# Patient Record
Sex: Female | Born: 1975 | Race: White | Hispanic: No | Marital: Married | Smoking: Never smoker
Health system: Southern US, Community
[De-identification: ages and names within clinical notes are randomized; demographics above are authoritative.]

## PROBLEM LIST (undated history)

## (undated) DIAGNOSIS — R519 Headache, unspecified: Secondary | ICD-10-CM

## (undated) HISTORY — PX: DENTAL SURGERY: SHX609

---

## 2020-03-04 ENCOUNTER — Telehealth: Payer: Self-pay | Admitting: Internal Medicine

## 2020-03-04 NOTE — Telephone Encounter (Signed)
Returned call to [employer], Debbie. States she spoke with someone at [facility] and they are requesting [facility] contact them for patient's records. States they won't need a ROI as it is Peer to Peer. Call placed to [facility] at [facility]. She is requesting an email sent to her at [email or web address] requesting records. Per [facility] policy, patient will need to fill out ROI that can then be faxed to [facility]. Debbie made aware. She will have patient sign ROI at upcoming visit and will do the same for the other 4 members of patient's family. Kinnie Feil, BSN, RN-BC

## 2020-03-04 NOTE — Telephone Encounter (Signed)
[place] Sponsor [employer] requesting a call back about Requesting Lab work from [employer] and other paper work for the patient's visit on Monday 03/08/2020.  She has other concerns and questions about this patient.

## 2020-03-08 ENCOUNTER — Ambulatory Visit (HOSPITAL_COMMUNITY)
Admission: RE | Admit: 2020-03-08 | Discharge: 2020-03-08 | Disposition: A | Discharge status: Home / Self Care | Payer: Medicaid Other | Source: Ambulatory Visit | Attending: Internal Medicine | Admitting: Internal Medicine

## 2020-03-08 ENCOUNTER — Other Ambulatory Visit: Payer: Self-pay

## 2020-03-08 ENCOUNTER — Ambulatory Visit (INDEPENDENT_AMBULATORY_CARE_PROVIDER_SITE_OTHER): Payer: Medicaid Other | Admitting: Internal Medicine

## 2020-03-08 VITALS — BP 97/62 | HR 82 | Temp 98.2°F | Ht 62.0 in | Wt 151.2 lb

## 2020-03-08 DIAGNOSIS — G8929 Other chronic pain: Secondary | ICD-10-CM | POA: Diagnosis not present

## 2020-03-08 DIAGNOSIS — R5383 Other fatigue: Secondary | ICD-10-CM | POA: Diagnosis not present

## 2020-03-08 DIAGNOSIS — M5440 Lumbago with sciatica, unspecified side: Secondary | ICD-10-CM

## 2020-03-08 DIAGNOSIS — M5442 Lumbago with sciatica, left side: Secondary | ICD-10-CM

## 2020-03-08 DIAGNOSIS — M25511 Pain in right shoulder: Secondary | ICD-10-CM | POA: Diagnosis not present

## 2020-03-08 DIAGNOSIS — E559 Vitamin D deficiency, unspecified: Secondary | ICD-10-CM | POA: Diagnosis not present

## 2020-03-08 DIAGNOSIS — Z0000 Encounter for general adult medical examination without abnormal findings: Secondary | ICD-10-CM | POA: Insufficient documentation

## 2020-03-08 LAB — GLUCOSE, CAPILLARY: Glucose-Capillary: 117 mg/dL — ABNORMAL HIGH (ref 70–99)

## 2020-03-08 LAB — POCT GLYCOSYLATED HEMOGLOBIN (HGB A1C): Hemoglobin A1C: 6 % — AB (ref 4.0–5.6)

## 2020-03-08 MED ORDER — DICLOFENAC SODIUM 1 % EX GEL: 2.0000 g | Freq: Four times a day (QID) | CUTANEOUS | 1 refills | Status: DC
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
## 2020-03-08 NOTE — Patient Instructions (Signed)
Thank you for allowing [place] to provide your care today. Today we discussed your back and shoulder pain and preventive care   I have ordered several labs for you. I will call if any are abnormal.    Please use Voltaren gel for shoulder and back pain as needed. You can also take over the counter Tylenol and take 1 tablet up to 3 times a day only as needed for pain.   Please come back to clinic in 1 month fr PCP visit and follow up. As always, if having severe symptoms, please seek medical attention at emergency room. Should you have any questions or concerns please call the internal medicine clinic at [phone].    Thank you!

## 2020-03-09 LAB — CBC WITH DIFFERENTIAL/PLATELET
Basophils Absolute: 0 10*3/uL (ref 0.0–0.2)
Basos: 0 %
EOS (ABSOLUTE): 0.2 10*3/uL (ref 0.0–0.4)
Eos: 3 %
Hematocrit: 39.9 % (ref 34.0–46.6)
Hemoglobin: 13.6 g/dL (ref 11.1–15.9)
Immature Grans (Abs): 0 10*3/uL (ref 0.0–0.1)
Immature Granulocytes: 0 %
Lymphocytes Absolute: 2.6 10*3/uL (ref 0.7–3.1)
Lymphs: 44 %
MCH: 28.5 pg (ref 26.6–33.0)
MCHC: 34.1 g/dL (ref 31.5–35.7)
MCV: 84 fL (ref 79–97)
Monocytes Absolute: 0.4 10*3/uL (ref 0.1–0.9)
Monocytes: 7 %
Neutrophils Absolute: 2.7 10*3/uL (ref 1.4–7.0)
Neutrophils: 46 %
Platelets: 194 10*3/uL (ref 150–450)
RBC: 4.78 x10E6/uL (ref 3.77–5.28)
RDW: 13.1 % (ref 11.7–15.4)
WBC: 6 10*3/uL (ref 3.4–10.8)

## 2020-03-09 LAB — LIPID PANEL
Chol/HDL Ratio: 3.9 ratio (ref 0.0–4.4)
Cholesterol, Total: 148 mg/dL (ref 100–199)
HDL: 38 mg/dL — ABNORMAL LOW (ref >39)
LDL Chol Calc (NIH): 89 mg/dL (ref 0–99)
Triglycerides: 115 mg/dL (ref 0–149)
VLDL Cholesterol Cal: 21 mg/dL (ref 5–40)

## 2020-03-09 LAB — IRON AND TIBC
Iron Saturation: 11 % — ABNORMAL LOW (ref 15–55)
Iron: 33 ug/dL (ref 27–159)
Total Iron Binding Capacity: 297 ug/dL (ref 250–450)
UIBC: 264 ug/dL (ref 131–425)

## 2020-03-09 LAB — CMP14 + ANION GAP
ALT: 31 IU/L (ref 0–32)
AST: 36 IU/L (ref 0–40)
Albumin/Globulin Ratio: 1.2 (ref 1.2–2.2)
Albumin: 4.2 g/dL (ref 3.8–4.8)
Alkaline Phosphatase: 92 IU/L (ref 44–121)
Anion Gap: 13 mmol/L (ref 10.0–18.0)
BUN/Creatinine Ratio: 30 — ABNORMAL HIGH (ref 9–23)
BUN: 29 mg/dL — ABNORMAL HIGH (ref 6–24)
Bilirubin Total: 0.3 mg/dL (ref 0.0–1.2)
CO2: 18 mmol/L — ABNORMAL LOW (ref 20–29)
Calcium: 9.2 mg/dL (ref 8.7–10.2)
Chloride: 108 mmol/L — ABNORMAL HIGH (ref 96–106)
Creatinine, Ser: 0.98 mg/dL (ref 0.57–1.00)
GFR calc Af Amer: 82 mL/min/{1.73_m2} (ref >59)
GFR calc non Af Amer: 71 mL/min/{1.73_m2} (ref >59)
Globulin, Total: 3.4 g/dL (ref 1.5–4.5)
Glucose: 112 mg/dL — ABNORMAL HIGH (ref 65–99)
Potassium: 3.4 mmol/L — ABNORMAL LOW (ref 3.5–5.2)
Sodium: 139 mmol/L (ref 134–144)
Total Protein: 7.6 g/dL (ref 6.0–8.5)

## 2020-03-09 LAB — TSH: TSH: 2.54 u[IU]/mL (ref 0.450–4.500)

## 2020-03-09 LAB — VITAMIN D 25 HYDROXY (VIT D DEFICIENCY, FRACTURES): Vit D, 25-Hydroxy: 25.9 ng/mL — ABNORMAL LOW (ref 30.0–100.0)

## 2020-03-09 MED ORDER — FERROUS SULFATE 325 (65 FE) MG PO TBEC: 325.0000 mg | DELAYED_RELEASE_TABLET | Freq: Two times a day (BID) | ORAL | 3 refills | Status: AC

## 2020-03-09 MED ORDER — VITAMIN D 50 MCG (2000 UT) PO TABS: 2000.0000 [IU] | ORAL_TABLET | Freq: Every day | ORAL | 2 refills | Status: DC

## 2020-03-10 ENCOUNTER — Encounter: Payer: Self-pay | Admitting: Internal Medicine

## 2020-03-10 DIAGNOSIS — M5412 Radiculopathy, cervical region: Secondary | ICD-10-CM | POA: Insufficient documentation

## 2020-03-10 DIAGNOSIS — R5383 Other fatigue: Secondary | ICD-10-CM | POA: Insufficient documentation

## 2020-03-10 DIAGNOSIS — M25511 Pain in right shoulder: Secondary | ICD-10-CM | POA: Insufficient documentation

## 2020-03-10 DIAGNOSIS — M5440 Lumbago with sciatica, unspecified side: Secondary | ICD-10-CM | POA: Insufficient documentation

## 2020-03-10 DIAGNOSIS — G8929 Other chronic pain: Secondary | ICD-10-CM | POA: Insufficient documentation

## 2020-03-10 DIAGNOSIS — E559 Vitamin D deficiency, unspecified: Secondary | ICD-10-CM | POA: Insufficient documentation

## 2020-03-10 NOTE — Assessment & Plan Note (Signed)
No red flag on symptoms or physical exam.  Has some mild tenderness on spine. Will obtain lubmar Xray per patient 'request> Unremarkable except DDD.  -Voltaren gel PRN
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
## 2020-03-10 NOTE — Assessment & Plan Note (Addendum)
-  Checking Vitamin D>Vit D level is low. Sending script for Vit D 2000 U QD. Patient notified.

## 2020-03-10 NOTE — Assessment & Plan Note (Signed)
Checking CBC, Stool ova and parasite, H pylori stool Ag (also reports occasional pus in her stool), CBC, TSH, Iron TIBC, CMP.  Labs suggestive of IDA> Sending script for ferrous sulfate.

## 2020-03-10 NOTE — Assessment & Plan Note (Deleted)
-  Checking Vitamin D>Vit D level is low. Sending script for Vit D 2000 U QD.

## 2020-03-10 NOTE — Assessment & Plan Note (Signed)
Checking Lipid panel today. 

## 2020-03-12 ENCOUNTER — Other Ambulatory Visit: Payer: Medicaid Other

## 2020-03-12 ENCOUNTER — Other Ambulatory Visit: Payer: Self-pay

## 2020-03-12 NOTE — Progress Notes (Signed)
Internal Medicine Clinic Attending  Case discussed with Dr. Masoudi  At the time of the visit.  We reviewed the resident's history and exam and pertinent patient test results.  I agree with the assessment, diagnosis, and plan of care documented in the resident's note.  

## 2020-03-15 ENCOUNTER — Telehealth: Payer: Self-pay | Admitting: Internal Medicine

## 2020-03-15 NOTE — Telephone Encounter (Signed)
I called her and talked to her. Thanks

## 2020-03-15 NOTE — Telephone Encounter (Signed)
Pt's Sponsor Debbie requesting a phone call back in reference to her stool samples and did Dr. Maryla Morrow give a number of Carbs per day.     Please call back.

## 2020-03-16 LAB — OVA AND PARASITE EXAMINATION

## 2020-03-16 LAB — STOOL CULTURE: E coli, Shiga toxin Assay: NEGATIVE

## 2020-03-16 LAB — H. PYLORI ANTIGEN, STOOL: H pylori Ag, Stl: NEGATIVE

## 2020-03-29 ENCOUNTER — Other Ambulatory Visit: Payer: Self-pay | Admitting: Internal Medicine

## 2020-03-29 DIAGNOSIS — Z0000 Encounter for general adult medical examination without abnormal findings: Secondary | ICD-10-CM

## 2020-03-29 NOTE — Addendum Note (Signed)
Addended byChevis Pretty on: 03/29/2020 11:40 AM   Modules accepted: Orders

## 2020-03-29 NOTE — Progress Notes (Signed)
Repeating H Pylori to rule out false negative test.

## 2020-04-01 ENCOUNTER — Other Ambulatory Visit: Payer: Self-pay

## 2020-04-01 ENCOUNTER — Ambulatory Visit (INDEPENDENT_AMBULATORY_CARE_PROVIDER_SITE_OTHER): Payer: Medicaid Other | Admitting: Student

## 2020-04-01 ENCOUNTER — Encounter: Payer: Self-pay | Admitting: Student

## 2020-04-01 VITALS — BP 107/65 | HR 69 | Temp 98.4°F | Ht 62.0 in | Wt 149.2 lb

## 2020-04-01 DIAGNOSIS — R5383 Other fatigue: Secondary | ICD-10-CM | POA: Diagnosis not present

## 2020-04-01 DIAGNOSIS — Z0000 Encounter for general adult medical examination without abnormal findings: Secondary | ICD-10-CM | POA: Diagnosis not present

## 2020-04-01 DIAGNOSIS — Z124 Encounter for screening for malignant neoplasm of cervix: Secondary | ICD-10-CM | POA: Diagnosis not present

## 2020-04-01 DIAGNOSIS — G43009 Migraine without aura, not intractable, without status migrainosus: Secondary | ICD-10-CM | POA: Insufficient documentation

## 2020-04-01 DIAGNOSIS — R519 Headache, unspecified: Secondary | ICD-10-CM | POA: Diagnosis not present

## 2020-04-01 DIAGNOSIS — R7303 Prediabetes: Secondary | ICD-10-CM | POA: Diagnosis not present

## 2020-04-01 DIAGNOSIS — M5440 Lumbago with sciatica, unspecified side: Secondary | ICD-10-CM | POA: Diagnosis not present

## 2020-04-01 DIAGNOSIS — G8929 Other chronic pain: Secondary | ICD-10-CM

## 2020-04-01 DIAGNOSIS — E559 Vitamin D deficiency, unspecified: Secondary | ICD-10-CM | POA: Diagnosis not present

## 2020-04-01 DIAGNOSIS — N912 Amenorrhea, unspecified: Secondary | ICD-10-CM

## 2020-04-01 LAB — POCT URINE PREGNANCY: Preg Test, Ur: NEGATIVE

## 2020-04-01 NOTE — Assessment & Plan Note (Signed)
Patient reports adherence to and tolerance of ferrous sulfate 325 mg twice daily which was started at last visit for iron studies suggestive of IDA.  Plan: - continue ferrous sulfate 325 mg twice daily

## 2020-04-01 NOTE — Assessment & Plan Note (Signed)
Patient reports she has never had a pap smear. Counseled on the benefits of screening. Talked patient through the steps of a pap smear. She states she is fearful of the pain/discomfort and would like to think further about it.  Plan: - Instructed patient to call the clinic if she would like to schedule a pap smear - Otherwise, address at next visit

## 2020-04-01 NOTE — Assessment & Plan Note (Signed)
She reports she has not had a menstrual period in 6 months. States she is sexually active with her husband. Does not think she is pregnant. Pregnancy test performed in clinic today was NEGATIVE.  Assessment/Plan: Patient is relatively young to be perimenopausal. Does not seem to have any symptoms suggestive of perimenopause. - Readdress at next visit

## 2020-04-01 NOTE — Assessment & Plan Note (Signed)
Patient reports over the last 3 years she has had periodic headaches after being outside in the sun for long periods of time. However over the last month since cutting out carbs she has headaches in between meals. The headaches resolve with eating and hydration. She denies associated photophobia, nausea/vomiting, lacrimation, rhinorrhea. Has not tried ibuprofen as the headaches improve with eating.  A/P: Headaches could be related to hypoglycemia as the patient has made some significant changes to her diet since finding out she has pre-diabetes, and the headaches improve with eating. Does not sound like migraine or even tension headache. No red flag symptoms. - Recommended patient eat a high protein snack in between meals - Continue to monitor

## 2020-04-01 NOTE — Assessment & Plan Note (Signed)
Patient reports she is using the voltaren gel prescribed at last visit (03/10/20) nightly before bed, and her back pain is significantly improved. On exam today she has no lower back tenderness to palpation. She expresses concern regarding the findings of mild multilevel lumbar spine DDD on the lumbar xray performed after last visit. Discussed that these are age-related changes that we will continue to monitor based on her symptoms.  - continue Voltaren gel PRN

## 2020-04-01 NOTE — Assessment & Plan Note (Signed)
Patient reports she has been taking the Vit D 2000 U supplement daily and has noticed improvement in her energy level.  Plan: - continue Vit D 2000 U daily

## 2020-04-01 NOTE — Patient Instructions (Addendum)
Kristina Jimenez,   Thank you for your visit to the [facility] today. It was a pleasure meeting you. Today we discussed the following:  1) Pre-diabetes - Your A1c at your last visit was 6.0% which puts you in the category of PRE-diabetes - We will not re-check your A1c until at least 2 months from now - I have made a referral to our diabetes educator, Lupita Leash, so you can learn more about pre-diabetes and healthy diet and lifestyle  2) 6 months without menstrual period:  - Pregnancy test today, I will call you with the result  3) Headaches - I think your headaches are occurring when you get too hungry between meals - Try eating a snack high in protein between meals   4) Medications - Continue taking your iron supplement and vitamin D supplement - Continue using the voltaren gel as needed and staying active  5) Pap smear - You are due for a pap smear for cervical cancer screening - We discussed how a pap smear is done, and you expressed that you would like to think about it some more before scheduling an appointment for this - Please call the clinic if/when you are ready to schedule   We would like to see you back for follow-up with your primary care doctor, Dr. Elaina Pattee, at the end of April. Please bring all of your medications with you.   If you have any questions or concerns, please call our clinic at [phone] between 9am-5pm. Outside of these hours, call [phone] and ask for the internal medicine resident on call. If you feel you are having a medical emergency please call 911.

## 2020-04-01 NOTE — Progress Notes (Signed)
   CC: follow-up  HPI:  [name] is a 45 y.o. woman and refugee from [place] with history of vitamin D deficiency who presents to clinic for follow-up from her last clinic visit on 03/08/20 with Dr. Maryla Morrow.  This patient encounter carried out with the assistance of a Dari interpretor. Patient's sponsor, Eunice Blase, present during this encounter.   To see the details of this patient's management of their acute and chronic problems, please refer to the Assessment & Plan under the Encounters tab.   Review of Systems:    Review of Systems  Constitutional: Negative for chills and fever.  Eyes: Negative for photophobia.  Cardiovascular: Negative for chest pain.  Gastrointestinal: Negative for abdominal pain, diarrhea, nausea and vomiting.  Musculoskeletal: Negative for back pain and joint pain.  Neurological: Positive for headaches. Negative for dizziness and weakness.  Psychiatric/Behavioral: Negative for depression.    Physical Exam:  Vitals:   04/01/20 1522  BP: 107/65  Pulse: 69  Temp: 98.4 F (36.9 C)  TempSrc: Oral  SpO2: 97%  Weight: 149 lb 3.2 oz (67.7 kg)  Height: 5\' 2"  (1.575 m)   Constitutional: well-appearing woman sitting in chair, in no acute distress HENT: normocephalic atraumatic, mucous membranes moist Eyes: conjunctiva non-erythematous Neck: supple Cardiovascular: regular rate and rhythm, no m/r/g Pulmonary/Chest: normal work of breathing on room air, lungs clear to auscultation bilaterally Abdominal: soft, non-distended MSK: normal bulk and tone; no midline spinal tenderness or paraspinal tenderness to palpation Neurological: alert & oriented x 3, normal gait Skin: warm and dry Psych: normal mood and affect    Assessment & Plan:   See Encounters Tab for problem based charting.  Patient discussed with Dr. 

## 2020-04-01 NOTE — Assessment & Plan Note (Signed)
Patient's sponsor, Eunice Blase, reports that the patient will be due for several immunizations in July. She does not have the patient's immunization record today but will bring at the next appointment.

## 2020-04-02 ENCOUNTER — Telehealth: Payer: Self-pay | Admitting: Dietician

## 2020-04-02 NOTE — Telephone Encounter (Signed)
Sponsor called to make an appointment, however when informed that her insurance does not cover prediabetes, the sponsor said she would ask the family if they wanted to pay for the visit. We agreed that information about prediabetes will be mailed to the sponsor to give to the patient.

## 2020-04-03 LAB — H. PYLORI ANTIGEN, STOOL: H pylori Ag, Stl: NEGATIVE

## 2020-04-05 ENCOUNTER — Encounter: Payer: Medicaid Other | Admitting: Internal Medicine

## 2020-04-05 ENCOUNTER — Telehealth: Payer: Self-pay | Admitting: Student

## 2020-04-05 NOTE — Telephone Encounter (Signed)
Rec'd a call from the pt's Sponsor (Ms. Wylene Men) requesting a call back in reference to possibly asking Dr. Maryla Morrow to call the patient to explain the importance of having a pelvic exam  (Pap Smear) performed.  Per Ms. Booze Dr. Maryla Morrow is able to speak the pt's lanuage and the pt can be reached directly @ [phone]

## 2020-04-07 NOTE — Progress Notes (Signed)
Internal Medicine Clinic Attending  Case discussed with Dr. Watson  At the time of the visit.  We reviewed the resident's history and exam and pertinent patient test results.  I agree with the assessment, diagnosis, and plan of care documented in the resident's note.  

## 2020-05-24 ENCOUNTER — Other Ambulatory Visit (HOSPITAL_COMMUNITY)
Admission: RE | Admit: 2020-05-24 | Discharge: 2020-05-24 | Disposition: A | Discharge status: Home / Self Care | Payer: Medicaid Other | Source: Ambulatory Visit | Attending: Internal Medicine | Admitting: Internal Medicine

## 2020-05-24 ENCOUNTER — Encounter: Payer: Self-pay | Admitting: Student

## 2020-05-24 ENCOUNTER — Other Ambulatory Visit: Payer: Self-pay

## 2020-05-24 ENCOUNTER — Ambulatory Visit (INDEPENDENT_AMBULATORY_CARE_PROVIDER_SITE_OTHER): Payer: Medicaid Other | Admitting: Student

## 2020-05-24 VITALS — BP 97/66 | HR 69 | Temp 98.5°F | Ht 62.0 in | Wt 141.1 lb

## 2020-05-24 DIAGNOSIS — Z1231 Encounter for screening mammogram for malignant neoplasm of breast: Secondary | ICD-10-CM

## 2020-05-24 DIAGNOSIS — N912 Amenorrhea, unspecified: Secondary | ICD-10-CM | POA: Diagnosis not present

## 2020-05-24 DIAGNOSIS — Z124 Encounter for screening for malignant neoplasm of cervix: Secondary | ICD-10-CM

## 2020-05-24 DIAGNOSIS — R7303 Prediabetes: Secondary | ICD-10-CM

## 2020-05-24 DIAGNOSIS — R739 Hyperglycemia, unspecified: Secondary | ICD-10-CM

## 2020-05-24 LAB — POCT GLYCOSYLATED HEMOGLOBIN (HGB A1C): Hemoglobin A1C: 5.7 % — AB (ref 4.0–5.6)

## 2020-05-24 LAB — GLUCOSE, CAPILLARY: Glucose-Capillary: 87 mg/dL (ref 70–99)

## 2020-05-24 NOTE — Patient Instructions (Signed)
It was good seeing you in the clinic today.  Today we discussed:  Pap smear: I will call you with the results  Menstruation: I will be checking labs for this. I will call you with results  Breast cancer screening: I have ordered a mammogram for you today.  You should be called to schedule a visit  Pre-diabetes: Congratulations on your weight loss. We will recheck your A1c today  Headaches: Please try Tylenol for headaches. Please make sure you eat regularly as this may be causing your headaches   Please follow up in 1-2 months   ~     ~??~ ?.    ~?:   ?:    ? ?  ?  ? ? ~  ?:  ?   ? ?  ?  ~.    ? ?  ?  ? ? ~  ?  ?:  ?  ?  . ?  ?? ? ?       ? ?: ~     ?~ ? ?.   A1c    ? ? ~?  :  ?  ?   ~?.   ? ~     ? ? ? ~        1-2  ? ??? ~?

## 2020-05-25 LAB — FOLLICLE STIMULATING HORMONE: FSH: 110 m[IU]/mL

## 2020-05-25 LAB — PROLACTIN: Prolactin: 14.3 ng/mL (ref 4.8–23.3)

## 2020-05-25 NOTE — Progress Notes (Signed)
   CC: Pap smear  HPI:  Ms.Pieper Innocent is a 45 y.o. female presents for pap smear and follow up of amenorrhea. Please refer to problem based charting for further details and assessment and plan of current problem and chronic medical conditions.  Review of Systems:  Negative as per HPI  Physical Exam:  Vitals:   05/24/20 1556  BP: 97/66  Pulse: 69  Temp: 98.5 F (36.9 C)  TempSrc: Oral  SpO2: 98%  Weight: 141 lb 1.6 oz (64 kg)  Height: 5\' 2"  (1.575 m)  Physical Exam Exam conducted with a chaperone present.  Constitutional:      Appearance: Normal appearance.  HENT:     Head: Normocephalic and atraumatic.     Right Ear: External ear normal.     Left Ear: External ear normal.     Nose: Nose normal.     Mouth/Throat:     Mouth: Mucous membranes are moist.     Pharynx: Oropharynx is clear.  Eyes:     Extraocular Movements: Extraocular movements intact.     Conjunctiva/sclera: Conjunctivae normal.     Pupils: Pupils are equal, round, and reactive to light.  Cardiovascular:     Rate and Rhythm: Normal rate and regular rhythm.     Pulses: Normal pulses.     Heart sounds: No murmur heard. No friction rub. No gallop.   Abdominal:     General: Abdomen is flat. Bowel sounds are normal. There is no distension.     Palpations: Abdomen is soft. There is no mass.     Tenderness: There is no abdominal tenderness.  Genitourinary:    General: Normal vulva.     Exam position: Lithotomy position.     Pubic Area: No rash.      Vagina: Normal. No lesions.     Cervix: Discharge (small amount whitish) present. No friability, lesion or erythema.  Musculoskeletal:     Right lower leg: No edema.     Left lower leg: No edema.  Lymphadenopathy:     Lower Body: No right inguinal adenopathy. No left inguinal adenopathy.  Skin:    General: Skin is warm and dry.     Capillary Refill: Capillary refill takes less than 2 seconds.     Findings: No lesion or rash.     Comments: No  hyperpigmentation, no hirsutism  Neurological:     General: No focal deficit present.     Mental Status: She is alert and oriented to person, place, and time. Mental status is at baseline.  Psychiatric:        Mood and Affect: Mood normal.        Behavior: Behavior normal.     Assessment & Plan:   See Encounters Tab for problem based charting.  Patient discussed with Dr. 

## 2020-05-26 DIAGNOSIS — R739 Hyperglycemia, unspecified: Secondary | ICD-10-CM | POA: Insufficient documentation

## 2020-05-26 NOTE — Assessment & Plan Note (Signed)
A1c of 6 from office visit 2 months ago. Patient has been working to make lifestyle changes including smaller portions and and exercising more. Has lost about 9 pounds in the last 2 month. Congratulated her on her efforts. A1c improved to 5.7% today. Discussed continue with health diet and lifestyle modifications.

## 2020-05-26 NOTE — Assessment & Plan Note (Addendum)
Continue to have amenorrhea. States this started about 8 months ago after leaving [place] and moving to the [place]. States her periods prior to this had been getting shorter and shorter from 7 days to about 3-4 days in length but was still getting periods at the same intervals. Denies hyperpigmentation, hirsutism, acne. Denies pelvic or abdominal pain or bloating, vaginal discharge or bleeding. Denies hot flashes, vaginal dryness or other perimenopausal symptoms. Mother had a hysterectomy following a difficult pregnancy and she is unclear when others in her family started menopause. Prior pregnancy test was negative in March. No signs of hyperandrogenism on exam.  Discussed we will check labs to see is she is perimenopausal versus other secondary causes of amenorrhea.   Plan - prolactin, FSH today

## 2020-05-26 NOTE — Assessment & Plan Note (Addendum)
Presents for PAP smear today. No prior history of pap smears in the past. No known history of cervical cancer in her family. She is sexually active with her husband. Denies any symptoms of discharge and bleeding. On exam vagina appear normal, no adnexal tenderness or obvious masses. Small amount of white discharge from cervix appear physiologic. Patient tolerated exam well. Will call her with results once they return.   Plan  PAP smear today

## 2020-05-28 ENCOUNTER — Telehealth: Payer: Self-pay

## 2020-05-28 LAB — CYTOLOGY - PAP
Comment: NEGATIVE
Diagnosis: NEGATIVE
High risk HPV: NEGATIVE

## 2020-05-28 NOTE — Telephone Encounter (Signed)
Return call to Marcine Matar, pt's sponsor, requesting lab results - sending request to Dr Elaina Pattee who saw pt. Also she asked for telephone to the breast center to schedule pt's mammogram which was given to [facility].

## 2020-05-28 NOTE — Telephone Encounter (Signed)
Kristina Jimenez regarding patient results [phone]

## 2020-05-29 NOTE — Telephone Encounter (Signed)
Attempted to call Kristina Jimenez but no answer. Was able to reach patient at home number and discussed lab results. Discussed her pap was normal and will need repeat in 5 years. FSH and PL levels in the menopausal range. Discussed that she is likely in menopause and will no longer have cycles and that while this may be earlier than others she remains in a normal range for onset of menopause. Also discussed A1c improved from last office visit and encouraged her to continue with healthy eating and ercise. Patient understands and all questions answered.

## 2020-06-02 ENCOUNTER — Telehealth: Payer: Self-pay

## 2020-06-02 NOTE — Telephone Encounter (Signed)
Pt's sponsor requesting test results for patient. Please call back.

## 2020-06-02 NOTE — Progress Notes (Signed)
Internal Medicine Clinic Attending  Case discussed with Dr. Elaina Pattee  At the time of the visit.  We reviewed the resident's history and exam and pertinent patient test results.  I agree with the assessment, diagnosis, and plan of care documented in the resident's note.  Published population health study in [place] suggests that age of menopause onset is younger than in the [place].

## 2020-06-08 NOTE — Telephone Encounter (Signed)
I spoke to her sponsor and informed her that her labs are consistent with menopause. She also had a normal Pap smear, will need repeat in 5 years. She does note that the patient has continued headaches, taking tylenol and ibuprofen intermittently for this. She is seeing the dentist on Thursday for tooth removal. All questions answered.

## 2020-06-08 NOTE — Telephone Encounter (Signed)
Pt's sponsor calling back for test results. Please call back.

## 2020-06-17 ENCOUNTER — Telehealth: Payer: Self-pay | Admitting: *Deleted

## 2020-06-17 NOTE — Telephone Encounter (Signed)
Call from pt's sponsor, [employer]. Stated pt has an oozing " spot" on her foot and ear. Stated pt's foot is red and swollen. And she had scabies about 6 months ago and was treated. Unsure if something had bit her. No available appts until Monday -  Suggested taking pt to UC to be evaluated. Stated she will.

## 2020-06-17 NOTE — Telephone Encounter (Signed)
Seen, agree with plan. 

## 2020-07-12 ENCOUNTER — Ambulatory Visit (INDEPENDENT_AMBULATORY_CARE_PROVIDER_SITE_OTHER): Payer: Medicaid Other | Admitting: Student

## 2020-07-12 ENCOUNTER — Other Ambulatory Visit: Payer: Self-pay

## 2020-07-12 VITALS — BP 94/63 | HR 78 | Temp 98.1°F | Ht 62.0 in | Wt 135.3 lb

## 2020-07-12 DIAGNOSIS — R21 Rash and other nonspecific skin eruption: Secondary | ICD-10-CM | POA: Diagnosis not present

## 2020-07-12 DIAGNOSIS — G43009 Migraine without aura, not intractable, without status migrainosus: Secondary | ICD-10-CM

## 2020-07-12 DIAGNOSIS — K625 Hemorrhage of anus and rectum: Secondary | ICD-10-CM | POA: Insufficient documentation

## 2020-07-12 DIAGNOSIS — G4440 Drug-induced headache, not elsewhere classified, not intractable: Secondary | ICD-10-CM | POA: Insufficient documentation

## 2020-07-12 HISTORY — DX: Hemorrhage of anus and rectum: K62.5

## 2020-07-12 MED ORDER — TOPIRAMATE 25 MG PO TABS: 50.0000 mg | ORAL_TABLET | Freq: Two times a day (BID) | ORAL | 2 refills | Status: AC

## 2020-07-12 NOTE — Patient Instructions (Addendum)
Ms.Klaire Thiemann,   Thank you for your visit to the [facility] today. It was a pleasure seeing you. Today we discussed the following:  1) Migraine headache, medication overuse headache - I am starting you on a daily migraine prophylaxis, topiramate - You will titrate this medication as follows:  Week 1: 25 mg orally once daily in the evening for first week; Week 2: 25 mg twice daily for second week; Week 3: 25 mg in the morning and 50 mg in the evening for third week; Week 4: 50 mg twice daily  - Drink more water!!  2) Rectal bleeding - It appears you have non-bleeding internal hemorrhoids - Increase the fiber in your diet - If they bleed, try Preparation H  3) Rash - continue to see dermatology   We would like to see you back in 3 months. Please bring all of your medications with you.   If you have any questions or concerns, please call our clinic at [phone] between 9am-5pm. Outside of these hours, call [phone] and ask for the internal medicine resident on call. If you feel you are having a medical emergency please call 911.

## 2020-07-12 NOTE — Assessment & Plan Note (Addendum)
Patient presents today to follow-up on persistent headaches.   She reports a 5-year history of generalized headaches which occur 3-4x weekly and are associated with nausea, occasional vomiting, and photophobia. She denies prodromal symptoms such as flashing lights or abnormal smells. These headaches are not associated with weakness, focal or otherwise. She reports they sometimes seem to be precipitated by warm/sunny weather but they also occur in the winter. States she drinks 2 bottles of water daily and 4 cups of tea, urinates 3x daily. Reports when she was in [place] she used to get IV fluid infusions which would usually prevent headaches for the next month. For the last few months, she has been taking ibuprofen or Excedrin with every headache, so about 3-4 times weekly. States these medications have good effect.  Note, during our last appointment, she denied associated N/V, photophobia.  A/P: Headaches are consistent with migraines without aura with concomitant medication overuse headache. Given the frequency of her headaches, will prescribe daily migraine prophylaxis. Considered propranolol, however her BP is soft (94/63). Will titrate topiramate and encourage patient to drink more water. - Start topiramate with titration as follows: Week 1: 25 mg orally once daily in the evening for first week; Week 2: 25 mg twice daily for second week; Week 3: 25 mg in the morning and 50 mg in the evening for third week; Week 4: 50 mg twice daily  - Decrease use of ibuprofen and Excedrin migraine - RTC in 3 months

## 2020-07-12 NOTE — Assessment & Plan Note (Signed)
Patient reports since tooth extraction on 06/16/20 she has had anywhere from 5-10 instances (difficult to pin patient down on timeline and frequency) of painless bright red blood per rectum after straining to have a BM. She states she has 2-3 BMs daily and they are soft in consistency. She states she will feel like she has to have a BM and then go to the restroom where she will strain for several minutes before anything comes out. She denies known history of anal fissures or hemorrhoids. Has had no change in her stool caliber.   On rectal exam, I do palpate possible internal hemorrhoids which are non-bleeding.  A/P: Suspect patient has non-bleeding internal hemorrhoids. Recommended she incorporate more fiber in her diet, avoid straining, and use preparation H if they start bleeding.

## 2020-07-12 NOTE — Progress Notes (Signed)
Office Visit   Patient ID: Kristina Jimenez, female    DOB: [birth year], 45 y.o.   MRN: [id number]   PCP: Quincy Simmonds, MD   Subjective:   CC: headaches, rash, blood in stool  HPI:  [name] is a 45 y.o. woman and refugee from [place] with history of vitamin D deficiency who presents to clinic for evaluation of the above. Her last clinic visit was on 05/24/20 with Dr. Elaina Pattee. This patient encounter was carried out with the assistance of a Dari interpretor. Patient's sponsor, Mellissa Kohut, present during this encounter.  To see the details of this patient's management of their acute and chronic problems, please refer to the Assessment & Plan under the Encounters tab.   Review of Systems:   Review of Systems  Constitutional:  Negative for chills, fever, malaise/fatigue and weight loss.  Eyes:  Negative for blurred vision.  Respiratory:  Negative for cough and shortness of breath.   Cardiovascular:  Negative for chest pain.  Gastrointestinal:  Positive for blood in stool. Negative for abdominal pain, constipation, diarrhea and melena.  Musculoskeletal:  Negative for myalgias.  Skin:  Positive for itching and rash.  Neurological:  Positive for headaches. Negative for dizziness, loss of consciousness and weakness.   No past medical history on file.     ACTIVE MEDICATIONS   Outpatient Medications Prior to Visit  Medication Sig Dispense Refill   Cholecalciferol (VITAMIN D) 50 MCG (2000 UT) tablet Take 1 tablet (2,000 Units total) by mouth daily. 90 tablet 2   diclofenac Sodium (VOLTAREN) 1 % GEL Apply 2 g topically 4 (four) times daily. 2 g 1   ferrous sulfate 325 (65 FE) MG EC tablet Take 1 tablet (325 mg total) by mouth 2 (two) times daily. 60 tablet 3   No facility-administered medications prior to visit.   Objective:   BP 94/63 (BP Location: Left Arm, Patient Position: Sitting, Cuff Size: Small)   Pulse 78   Temp 98.1 F (36.7 C) (Oral)   Ht 5\' 2"  (1.575 m)   Wt 135  lb 4.8 oz (61.4 kg)   BMI 24.75 kg/m  Wt Readings from Last 3 Encounters:  07/12/20 135 lb 4.8 oz (61.4 kg)  05/24/20 141 lb 1.6 oz (64 kg)  04/01/20 149 lb 3.2 oz (67.7 kg)   BP Readings from Last 3 Encounters:  07/12/20 94/63  05/24/20 97/66  04/01/20 107/65   Constitutional: well-appearing woman sitting in chair, in no acute distress HENT: normocephalic atraumatic, mucous membranes moist Eyes: conjunctiva non-erythematous Neck: supple Cardiovascular: regular rate and rhythm, no m/r/g, no lower extremity edema except for mild swelling of bilateral lateral malleoli Pulmonary/Chest: normal work of breathing on room air, lungs clear to auscultation bilaterally Abdominal: soft, non-tender, non-distended MSK: normal bulk and tone Neurological: alert & oriented x 3, normal gait Skin: warm and dry, few, small (1/4 cm), scattered, faint, hyperpigmented areas on bilateral feet (see pictures or in Media tab), also two similar areas on R hand; medial aspect of L ankle has areas of dry skin and excoriation Note: hypopigmented area on dorsal aspect of R foot is old Psych: Normal mood and affect  Rectal: Anus appears normal, no tears evident; on palpation of rectum there is an area of soft, worm-like tissue, no blood, no pain with exam         Health Maintenance:   Health Maintenance  Topic Date Due   HIV Screening  Never done   Hepatitis C Screening  Never done  TETANUS/TDAP  Never done   INFLUENZA VACCINE  08/30/2020   PAP SMEAR-Modifier  05/25/2023   Zoster Vaccines- Shingrix (1 of 2) 10/08/2027   Pneumococcal Vaccine 46-25 Years old  Aged Out   HPV VACCINES  Aged Out     Assessment & Plan:   Problem List Items Addressed This Visit       Cardiovascular and Mediastinum   Migraine without aura - Primary    Patient presents today to follow-up on persistent headaches.   She reports a 5-year history of generalized headaches which occur 3-4x weekly and are associated with  nausea, occasional vomiting, and photophobia. She denies prodromal symptoms such as flashing lights or abnormal smells. These headaches are not associated with weakness, focal or otherwise. She reports they sometimes seem to be precipitated by warm/sunny weather but they also occur in the winter. States she drinks 2 bottles of water daily and 4 cups of tea, urinates 3x daily. Reports when she was in [place] she used to get IV fluid infusions which would usually prevent headaches for the next month. For the last few months, she has been taking ibuprofen or Excedrin with every headache, so about 3-4 times weekly. States these medications have good effect.  Note, during our last appointment, she denied associated N/V, photophobia.  A/P: Headaches are consistent with migraines without aura with concomitant medication overuse headache. Given the frequency of her headaches, will prescribe daily migraine prophylaxis. Considered propranolol, however her BP is soft (94/63). Will titrate topiramate and encourage patient to drink more water. - Start topiramate with titration as follows: Week 1: 25 mg orally once daily in the evening for first week; Week 2: 25 mg twice daily for second week; Week 3: 25 mg in the morning and 50 mg in the evening for third week; Week 4: 50 mg twice daily  - Decrease use of ibuprofen and Excedrin migraine - RTC in 3 months       Relevant Medications   topiramate (TOPAMAX) 25 MG tablet     Digestive   Bright red rectal bleeding    Patient reports since tooth extraction on 06/16/20 she has had anywhere from 5-10 instances (difficult to pin patient down on timeline and frequency) of painless bright red blood per rectum after straining to have a BM. She states she has 2-3 BMs daily and they are soft in consistency. She states she will feel like she has to have a BM and then go to the restroom where she will strain for several minutes before anything comes out. She denies known history of  anal fissures or hemorrhoids. Has had no change in her stool caliber.   On rectal exam, I do palpate possible internal hemorrhoids which are non-bleeding.  A/P: Suspect patient has non-bleeding internal hemorrhoids. Recommended she incorporate more fiber in her diet, avoid straining, and use preparation H if they start bleeding.          Musculoskeletal and Integument   Rash and nonspecific skin eruption    Patient reports development of rash on her bilateral feet associated with mild bilateral ankle swelling (see Media tab) after she had teeth extracted on 06/16/20. Her sponsor thinks she may have taken amoxicillin around the time of the tooth extraction. Patient states the lesions are very pruritic. She saw a dermatologist recently, and per the sponsor, the dermatologist suspected some sort of contact dermatitis and put the patient on steroid cream. The patient reports she has been applying the steroid cream with some  improvement in her rash. Per the patient's daughter via the sponsor, the patient may have also been prescribed an oral steroid.  Assessment/Plan: Unclear etiology of patient's rash. Topical steroid is appropriate given the pruritis, and I will defer further management to the dermatologist the patient is currently seeing. Unfortunately I do not have their records.          Other   Medication overuse headache    See problem-based A&P for migraine without aura. Patient meets criteria for both.       Relevant Medications   topiramate (TOPAMAX) 25 MG tablet     Return in about 3 months (around 10/12/2020).   Pt discussed with Dr. Mayford Knife.  Alphonzo Severance, MD Internal Medicine Resident, PGY-1 [facility] Pager: [phone] 5:57 PM, 07/12/2020

## 2020-07-12 NOTE — Assessment & Plan Note (Signed)
See problem-based A&P for migraine without aura. Patient meets criteria for both.

## 2020-07-12 NOTE — Assessment & Plan Note (Signed)
Patient reports development of rash on her bilateral feet associated with mild bilateral ankle swelling (see Media tab) after she had teeth extracted on 06/16/20. Her sponsor thinks she may have taken amoxicillin around the time of the tooth extraction. Patient states the lesions are very pruritic. She saw a dermatologist recently, and per the sponsor, the dermatologist suspected some sort of contact dermatitis and put the patient on steroid cream. The patient reports she has been applying the steroid cream with some improvement in her rash. Per the patient's daughter via the sponsor, the patient may have also been prescribed an oral steroid.  Assessment/Plan: Unclear etiology of patient's rash. Topical steroid is appropriate given the pruritis, and I will defer further management to the dermatologist the patient is currently seeing. Unfortunately I do not have their records.

## 2020-07-13 NOTE — Progress Notes (Signed)
Internal Medicine Clinic Attending  Case discussed with Dr. Watson  At the time of the visit.  We reviewed the resident's history and exam and pertinent patient test results.  I agree with the assessment, diagnosis, and plan of care documented in the resident's note.  

## 2020-07-26 ENCOUNTER — Other Ambulatory Visit: Payer: Self-pay | Admitting: Student

## 2020-07-26 ENCOUNTER — Ambulatory Visit
Admission: RE | Admit: 2020-07-26 | Discharge: 2020-07-26 | Disposition: A | Discharge status: Home / Self Care | Payer: Medicaid Other | Source: Ambulatory Visit | Attending: Internal Medicine | Admitting: Internal Medicine

## 2020-07-26 ENCOUNTER — Other Ambulatory Visit: Payer: Self-pay

## 2020-07-26 DIAGNOSIS — Z1231 Encounter for screening mammogram for malignant neoplasm of breast: Secondary | ICD-10-CM

## 2020-07-29 ENCOUNTER — Other Ambulatory Visit: Payer: Self-pay | Admitting: Internal Medicine

## 2020-07-29 DIAGNOSIS — R928 Other abnormal and inconclusive findings on diagnostic imaging of breast: Secondary | ICD-10-CM

## 2020-08-20 ENCOUNTER — Other Ambulatory Visit: Payer: Medicaid Other

## 2020-09-10 ENCOUNTER — Other Ambulatory Visit: Payer: Self-pay

## 2020-09-10 ENCOUNTER — Ambulatory Visit
Admission: RE | Admit: 2020-09-10 | Discharge: 2020-09-10 | Disposition: A | Discharge status: Home / Self Care | Payer: Medicaid Other | Source: Ambulatory Visit | Attending: Internal Medicine | Admitting: Internal Medicine

## 2020-09-10 ENCOUNTER — Other Ambulatory Visit: Payer: Self-pay | Admitting: Internal Medicine

## 2020-09-10 DIAGNOSIS — N6310 Unspecified lump in the right breast, unspecified quadrant: Secondary | ICD-10-CM

## 2020-09-10 DIAGNOSIS — R928 Other abnormal and inconclusive findings on diagnostic imaging of breast: Secondary | ICD-10-CM

## 2020-09-21 ENCOUNTER — Other Ambulatory Visit: Payer: Self-pay | Admitting: Diagnostic Radiology

## 2020-09-21 ENCOUNTER — Ambulatory Visit
Admission: RE | Admit: 2020-09-21 | Discharge: 2020-09-21 | Disposition: A | Discharge status: Home / Self Care | Payer: Medicaid Other | Source: Ambulatory Visit | Attending: Internal Medicine | Admitting: Internal Medicine

## 2020-09-21 ENCOUNTER — Other Ambulatory Visit: Payer: Self-pay

## 2020-09-21 DIAGNOSIS — N6310 Unspecified lump in the right breast, unspecified quadrant: Secondary | ICD-10-CM

## 2020-09-21 HISTORY — PX: BREAST BIOPSY: SHX20

## 2020-10-08 ENCOUNTER — Ambulatory Visit: Payer: Medicaid Other | Admitting: Internal Medicine

## 2020-10-08 VITALS — BP 90/61 | HR 58 | Temp 98.1°F | Ht 62.0 in | Wt 130.4 lb

## 2020-10-08 DIAGNOSIS — G43009 Migraine without aura, not intractable, without status migrainosus: Secondary | ICD-10-CM | POA: Diagnosis not present

## 2020-10-08 DIAGNOSIS — K625 Hemorrhage of anus and rectum: Secondary | ICD-10-CM | POA: Diagnosis not present

## 2020-10-08 DIAGNOSIS — F5101 Primary insomnia: Secondary | ICD-10-CM | POA: Diagnosis not present

## 2020-10-08 DIAGNOSIS — R739 Hyperglycemia, unspecified: Secondary | ICD-10-CM | POA: Diagnosis present

## 2020-10-08 LAB — POCT GLYCOSYLATED HEMOGLOBIN (HGB A1C): Hemoglobin A1C: 5.6 % (ref 4.0–5.6)

## 2020-10-08 LAB — GLUCOSE, CAPILLARY: Glucose-Capillary: 85 mg/dL (ref 70–99)

## 2020-10-08 NOTE — Patient Instructions (Signed)
Take 5-10mg  of melatonin approximately 30 minutes before you want to sleep.   I would like you to follow up in 2 weeks so we can re-evaluate this and follow up on your other complaints.

## 2020-10-10 ENCOUNTER — Encounter: Payer: Self-pay | Admitting: Internal Medicine

## 2020-10-10 DIAGNOSIS — G4700 Insomnia, unspecified: Secondary | ICD-10-CM | POA: Insufficient documentation

## 2020-10-10 NOTE — Assessment & Plan Note (Addendum)
She notes resolution of headaches since her last office visit with Dr. Claudette Laws at which time she was started on topamax.   Continue current management.

## 2020-10-10 NOTE — Progress Notes (Signed)
   Office Visit   Patient ID: Kristina Jimenez, female    DOB: [birth year], 45 y.o.   MRN: [id number]   PCP: Quincy Simmonds, MD   Subjective:  Kristina Jimenez is a 45 y.o. year old female who presents for a multitude of acute complaints including rectal bleeding, urinary difficulties, difficulty sleeping, dry mouth, A1C check, and headache follow up. Please refer to problem based charting for assessment and plan.  Please note that an in person interpreter was used for the entirety of the visit.   Objective:   BP 90/61   Pulse (!) 58   Temp 98.1 F (36.7 C) (Oral)   Ht 5\' 2"  (1.575 m)   Wt 130 lb 6.4 oz (59.1 kg)   SpO2 100%   BMI 23.85 kg/m  BP Readings from Last 3 Encounters:  10/08/20 90/61  07/12/20 94/63  05/24/20 97/66   General: well appearing female Cardiac: RRR Pulm: lungs clear GI: abd soft, non-tender. Rectal exam chaperoned by Maime, RN--no hemorrhoids or blood present.   Assessment & Plan:   Problem List Items Addressed This Visit       Cardiovascular and Mediastinum   Migraine without aura    She notes resolution of headaches since her last office visit with Dr. 05/26/20 at which time she was started on topamax.  Continue current management.        Digestive   Bright red rectal bleeding    She presented for a multitude of acute complaints at today's visit. One of them includes persistent blood on her toliet paper after bowel movements. She denies constipation or having to strain at today's visit. She denies blood in her actual bowel movements. She denies pain. She denies change in appetite or other red flags although chart review does suggest ~5lb weight loss since June.  I agree with Dr. July assessment from June that this is likely internal hemmroids. I did not appreciate any on my exam however remainder of history of consistent with this.  Plan If bleeding is still present when she follows up in 2 weeks, would recommend referral to GI for consideration of  endoscopic evaluation.         Other   Hyperglycemia - Primary    She requested a check of her A1C today. She has requested repeat testing multiple times in the past year. A1C is 5.6 today.  Would recommend declining repeat testing unless a specific indication arises.      Relevant Orders   POC Hbg A1C (Completed)   Insomnia    She reports difficulty falling asleep over the past 3 weeks. On further questioning, it seems to relate to her worries about her family in [place].  Recommended to avoid caffeine after 1pm. Reviewed sleep hygiene with her Recommended melatonin       Due to the multitude of acute issues at today's visit and time constraints, I requested she arrange an office visit to discuss these.  Return in about 2 weeks (around 10/22/2020).   Pt discussed with Dr. 10/24/2020, MD Internal Medicine Resident PGY-3 [facility] 10/10/2020 1:18 PM

## 2020-10-10 NOTE — Assessment & Plan Note (Signed)
She presented for a multitude of acute complaints at today's visit. One of them includes persistent blood on her toliet paper after bowel movements. She denies constipation or having to strain at today's visit. She denies blood in her actual bowel movements. She denies pain. She denies change in appetite or other red flags although chart review does suggest ~5lb weight loss since June.  I agree with Dr. Aura Dials assessment from June that this is likely internal hemmroids. I did not appreciate any on my exam however remainder of history of consistent with this.  Plan  If bleeding is still present when she follows up in 2 weeks, would recommend referral to GI for consideration of endoscopic evaluation.

## 2020-10-10 NOTE — Assessment & Plan Note (Signed)
She requested a check of her A1C today. She has requested repeat testing multiple times in the past year. A1C is 5.6 today.  Would recommend declining repeat testing unless a specific indication arises.

## 2020-10-10 NOTE — Assessment & Plan Note (Signed)
She reports difficulty falling asleep over the past 3 weeks. On further questioning, it seems to relate to her worries about her family in [place].   Recommended to avoid caffeine after 1pm.  Reviewed sleep hygiene with her  Recommended melatonin

## 2020-10-18 ENCOUNTER — Telehealth: Payer: Self-pay

## 2020-10-18 NOTE — Telephone Encounter (Signed)
Pt's sponsor requesting lab results, please call back.  

## 2020-10-18 NOTE — Progress Notes (Signed)
Internal Medicine Clinic Attending  Case discussed with Dr. Christian  At the time of the visit.  We reviewed the resident's history and exam and pertinent patient test results.  I agree with the assessment, diagnosis, and plan of care documented in the resident's note.  

## 2020-10-19 ENCOUNTER — Telehealth: Payer: Self-pay | Admitting: Internal Medicine

## 2020-10-19 NOTE — Telephone Encounter (Signed)
Lab results reviewed with sponsor.

## 2021-03-16 ENCOUNTER — Encounter: Payer: Self-pay | Admitting: Internal Medicine

## 2021-03-16 ENCOUNTER — Ambulatory Visit (INDEPENDENT_AMBULATORY_CARE_PROVIDER_SITE_OTHER): Payer: Medicaid Other | Admitting: Internal Medicine

## 2021-03-16 VITALS — BP 90/62 | HR 77 | Temp 98.2°F | Wt 127.2 lb

## 2021-03-16 DIAGNOSIS — G8929 Other chronic pain: Secondary | ICD-10-CM

## 2021-03-16 DIAGNOSIS — M25511 Pain in right shoulder: Secondary | ICD-10-CM

## 2021-03-16 DIAGNOSIS — R739 Hyperglycemia, unspecified: Secondary | ICD-10-CM

## 2021-03-16 DIAGNOSIS — R5383 Other fatigue: Secondary | ICD-10-CM

## 2021-03-16 DIAGNOSIS — R634 Abnormal weight loss: Secondary | ICD-10-CM | POA: Diagnosis not present

## 2021-03-16 LAB — GLUCOSE, CAPILLARY: Glucose-Capillary: 102 mg/dL — ABNORMAL HIGH (ref 70–99)

## 2021-03-16 LAB — POCT GLYCOSYLATED HEMOGLOBIN (HGB A1C): Hemoglobin A1C: 5.3 % (ref 4.0–5.6)

## 2021-03-16 NOTE — Patient Instructions (Addendum)
Ms Kristina Jimenez,  It was a pleasure seeing you in clinic. Today we discussed:   Right shoulder and arm pain:  Please take either Tylenol or Advil scheduled for the next week. Following this, you may take as needed. You may use heat or ice therapy. Please try to perform the exercises provided 2-3 times daily. If no improvement in symptoms, please let us know and we can get further imaging.   Weight loss: This is likely in setting of not being able to eat. Please follow up with the dentist. Meanwhile, you may drink Ensure or Boost to maintain protein and calorie intake.   If you have any questions or concerns, please call our clinic at [phone] between 9am-5pm and after hours call [phone] and ask for the internal medicine resident on call. If you feel you are having a medical emergency please call 911.   Thank you, we look forward to helping you remain healthy!

## 2021-03-16 NOTE — Progress Notes (Signed)
° °  CC: right arm pain  HPI:  [name] is a 46 y.o. female with right arm and shoulder pain that has been present for the past 2-3 months that she rates as a 9/10. She reports it as a "deep" pain but denies any throbbing, burning or change in sensation. Reports it feels similar to prior shoulder pain. Intermittently takes OTC tylenol for this. Also concerned about ongoing weight loss. Please see problem based charting for complete assessment and plan.   No past medical history on file. Review of Systems:  Negative except as stated in HPI  Physical Exam:  Vitals:   03/16/21 0933  BP: 90/62  Pulse: 77  Temp: 98.2 F (36.8 C)  TempSrc: Oral  SpO2: 99%  Weight: 127 lb 3.3 oz (57.7 kg)   Physical Exam  Constitutional: Appears well-developed and well-nourished. No distress.  HENT: Normocephalic and atraumatic, poor dentition and edentulous on bottom teeth, moist mucous membranes Cardiovascular: Normal rate, regular rhythm, S1 and S2 present, no murmurs, rubs, gallops.  Distal pulses intact Respiratory: Effort is normal on room air  Musculoskeletal: Normal bulk and tone.  No peripheral edema noted. R shoulder tenderness to palpation; full ROM with some pain on forward flexion and internal rotation of R shoulder.  Neurological: Is alert and oriented x4, no apparent focal deficits noted. Skin: Warm and dry.  No rash, erythema, lesions noted. Psychiatric: Normal mood and affect.    Assessment & Plan:   See Encounters Tab for problem based charting.  Patient discussed with Dr.  Saverio Danker

## 2021-03-16 NOTE — Assessment & Plan Note (Signed)
Patient with history of prediabetes with most recent A1c 5.6. She reports having dry mouth and frequent urination for which she wants to follow up her A1c. She also believes her weight loss is attributed to her diabetes - provided reassurance that this is likely from decreased oral intake in setting of edentulous status. Repeat A1c is 5.3 at this visit.   No further A1c testing necessary at this time

## 2021-03-16 NOTE — Assessment & Plan Note (Addendum)
Patient endorsing ongoing weight loss. She has lost approximately 20lbs over the past year in setting of poor dentition. She does not currently have any bottom teeth. She is mostly relying on liquid for nutrition. BMI within normal range at this visit  Patient advised to drink Ensures or Boost to maintain protein/calorie nutrition  Will have to follow up with dentist for dentures vs implants

## 2021-03-16 NOTE — Assessment & Plan Note (Signed)
Patient previously evaluated for chronic right shoulder pain secondary to trauma. She was recommended for imaging but she would like to hold off at the time. Patient reports ongoing right shoulder pain that is now radiating into the right arm for the past 2-3 months. She notes this as a "deep" pain but denies any throbbing sensation, numbness/tingling or burning. On examination, has some tenderness around shoulder blade and deltoid area. Has full ROM of the right shoulder with some pain on forward flexion and internal rotation. No motor weakness or sensation changes noted.   Assessment/Plan: Suspect may have some chronic rotator cuff injury with possible peripheral nerve injury. Patient advised for scheduled analgesic regimen for one week with stretching exercises 2-3 times daily  If no improvement in symptoms, would recommend further imaging

## 2021-03-21 NOTE — Progress Notes (Signed)
Internal Medicine Clinic Attending  Case discussed with Dr. Mcarthur Rossetti  At the time of the visit.  We reviewed the residents history and exam and pertinent patient test results.  I agree with the assessment, diagnosis, and plan of care documented in the residents note. Will need close f/u to monitor for further weight loss and need for further evaluation.

## 2021-04-26 ENCOUNTER — Encounter: Payer: Self-pay | Admitting: Student

## 2021-04-26 ENCOUNTER — Ambulatory Visit (INDEPENDENT_AMBULATORY_CARE_PROVIDER_SITE_OTHER): Payer: Medicaid Other | Admitting: Student

## 2021-04-26 ENCOUNTER — Other Ambulatory Visit: Payer: Self-pay

## 2021-04-26 VITALS — BP 91/65 | HR 61 | Temp 98.4°F | Resp 20 | Ht 62.0 in | Wt 125.3 lb

## 2021-04-26 DIAGNOSIS — R634 Abnormal weight loss: Secondary | ICD-10-CM

## 2021-04-26 DIAGNOSIS — R5383 Other fatigue: Secondary | ICD-10-CM

## 2021-04-26 NOTE — Assessment & Plan Note (Addendum)
Assessment: ?Continues to have weight loss over the past 6 months since having her bottom teeth removed. She has difficulty eating and during her prior office visit was recommended to try nutritional supplements such as protein drinks. She notes that dentures would cost her $10,000. I discussed this with her daughter who noted medicaid will cover the dentures if we believe they are medically necessary. A letter was written and given to the patient stating this.  ? ?Hx of thyroid disease in the past, however, not endorsing other symptoms of hyperthyroidism. Likely from her poor nutritional state. Denies symptoms of night sweats, low suspicion for malignancy at this time.  ? ?Plan: ?-continue to encourage PO intake as tolerable and protein supplements ?-follow up with status of patient getting dentures ?

## 2021-04-26 NOTE — Progress Notes (Signed)
? ?CC: follow up decreased energy and weight loss ? ?HPI: ? ?Kristina Jimenez is a 46 y.o. female living with a history stated below and presents today for follow up of her decreased energy and weight loss. Please see problem based assessment and plan for additional details. ? ?Past Medical History:  ?Diagnosis Date  ? Bright red rectal bleeding 07/12/2020  ? ? ?Current Outpatient Medications on File Prior to Visit  ?Medication Sig Dispense Refill  ? Cholecalciferol (VITAMIN D) 50 MCG (2000 UT) tablet Take 1 tablet (2,000 Units total) by mouth daily. 90 tablet 2  ? diclofenac Sodium (VOLTAREN) 1 % GEL Apply 2 g topically 4 (four) times daily. 2 g 1  ? ferrous sulfate 325 (65 FE) MG EC tablet Take 1 tablet (325 mg total) by mouth 2 (two) times daily. 60 tablet 3  ? topiramate (TOPAMAX) 25 MG tablet Take 2 tablets (50 mg total) by mouth 2 (two) times daily. Take 25 mg orally once daily in the evening for first week, 25 mg twice daily for second week, 25 mg in the morning and 50 mg in the evening for third week, and then 50 mg twice daily 60 tablet 2  ? ?No current facility-administered medications on file prior to visit.  ? ? ?No family history on file. ? ?Social History  ? ?Socioeconomic History  ? Marital status: Married  ?  Spouse name: Not on file  ? Number of children: Not on file  ? Years of education: Not on file  ? Highest education level: Not on file  ?Occupational History  ? Not on file  ?Tobacco Use  ? Smoking status: Never  ? Smokeless tobacco: Not on file  ?Substance and Sexual Activity  ? Alcohol use: Not on file  ? Drug use: Not on file  ? Sexual activity: Not on file  ?Other Topics Concern  ? Not on file  ?Social History Narrative  ? Not on file  ? ?Social Determinants of Health  ? ?Financial Resource Strain: Not on file  ?Food Insecurity: Not on file  ?Transportation Needs: Not on file  ?Physical Activity: Not on file  ?Stress: Not on file  ?Social Connections: Not on file  ?Intimate Partner Violence:  Not on file  ? ? ?Review of Systems: ?ROS negative except for what is noted on the assessment and plan. ? ?Vitals:  ? 04/26/21 1031  ?BP: 91/65  ?Pulse: 61  ?Resp: 20  ?Temp: 98.4 ?F (36.9 ?C)  ?TempSrc: Oral  ?SpO2: 100%  ?Weight: 125 lb 4.8 oz (56.8 kg)  ?Height: 5\' 2"  (1.575 m)  ? ? ?Physical Exam: ?Constitutional: well appearing, no acute distress ?Neck: supple ?Cardiovascular: regular rate and rhythm, no m/r/g ?Pulmonary/Chest: normal work of breathing on room air, lungs clear to auscultation bilaterally ?MSK: normal bulk and tone ?Neurological: alert & oriented x 3 ?Skin: warm and dry ?Psych: normal mood and thought process ? ?Assessment & Plan:  ? ?Weight loss ?Assessment: ?Continues to have weight loss over the past 6 months since having her bottom teeth removed. She has difficulty eating and during her prior office visit was recommended to try nutritional supplements such as protein drinks. She notes that dentures would cost her $10,000. I discussed this with her daughter who noted medicaid will cover the dentures if we believe they are medically necessary. A letter was written and given to the patient stating this.  ? ?Hx of thyroid disease in the past, however, not endorsing other symptoms of hyperthyroidism. Likely  from her poor nutritional state. Denies symptoms of night sweats, low suspicion for malignancy at this time.  ? ?Plan: ?-continue to encourage PO intake as tolerable and protein supplements ?-follow up with status of patient getting dentures ? ?Low energy ?Assessment: ?Hx of thyroid disease, uncertain if hyper or hypothyroidism. She has been in the [place] 15 months and has not taken thyroid medicine since moving here. She is uncertain of which medication she was taking.  ? ?Will recheck thyroid studies today, TSH, T4 and T3. Poor PO intake from her having bottom teeth removed likely also contributing. In the past thought to have iron deficiency, however, I do not believe this is contributing.   ? ?Plan: ?-TSH,T4,T3 pending ? ?Patient discussed with Dr. Criselda Peaches ? ?Thalia Bloodgood, D.O. ?[facility], PGY-2 ?Pager: [phone], Phone: [phone] ?Date 04/26/2021 Time 9:17 PM  ?

## 2021-04-26 NOTE — Patient Instructions (Addendum)
Thank you, Ms.Renai Abrams for allowing [place] to provide your care today. Today we discussed. ? ?Low energy ?We will be checking your thyroid levels.  ? ?Teeth Removal ?I have given you a letter saying it is medically necessary to have dentures. Please give this to your insurance.  ? ?I have ordered the following labs for you: ? ? ?Lab Orders    ?     TSH    ?     T4, Free    ?     T3     ? ? ?Referrals ordered today:  ? ?Referral Orders  ?No referral(s) requested today  ?  ? ?I have ordered the following medication/changed the following medications:  ? ?Stop the following medications: ?There are no discontinued medications.  ? ?Start the following medications: ?No orders of the defined types were placed in this encounter. ?  ? ?Follow up: 3 months with PCP ? ? ?Should you have any questions or concerns please call the internal medicine clinic at [phone].   ? ?Sanjuana Letters, D.O. ?[place] ?  ?

## 2021-04-26 NOTE — Assessment & Plan Note (Addendum)
Assessment: ?Hx of thyroid disease, uncertain if hyper or hypothyroidism. She has been in the [place] 15 months and has not taken thyroid medicine since moving here. She is uncertain of which medication she was taking.  ? ?Will recheck thyroid studies today, TSH, T4 and T3. Poor PO intake from her having bottom teeth removed likely also contributing. In the past thought to have iron deficiency, however, I do not believe this is contributing.  ? ?Plan: ?-TSH,T4,T3 pending ? ?Addendum: ?Subclinical hypothyroidism, TSH slightly elevated with normal T3 and T4 levels. Will continue to monitor. Diet likely main contributor to her low energy. Will keep working to get her seen by a dentist. The patient was called using a dari interpretor.  ?

## 2021-04-27 LAB — T3: T3, Total: 130 ng/dL (ref 71–180)

## 2021-04-27 LAB — T4, FREE: Free T4: 0.96 ng/dL (ref 0.82–1.77)

## 2021-04-27 LAB — TSH: TSH: 4.78 u[IU]/mL — ABNORMAL HIGH (ref 0.450–4.500)

## 2021-05-17 NOTE — Progress Notes (Signed)
Internal Medicine Clinic Attending  Case discussed with Dr. Katsadouros at the time of the visit.  We reviewed the resident's history and exam and pertinent patient test results.  I agree with the assessment, diagnosis, and plan of care documented in the resident's note.  

## 2021-05-28 ENCOUNTER — Encounter (HOSPITAL_BASED_OUTPATIENT_CLINIC_OR_DEPARTMENT_OTHER): Payer: Self-pay

## 2021-05-28 ENCOUNTER — Emergency Department (HOSPITAL_BASED_OUTPATIENT_CLINIC_OR_DEPARTMENT_OTHER): Payer: Medicaid Other | Admitting: Radiology

## 2021-05-28 ENCOUNTER — Other Ambulatory Visit: Payer: Self-pay

## 2021-05-28 ENCOUNTER — Emergency Department (HOSPITAL_BASED_OUTPATIENT_CLINIC_OR_DEPARTMENT_OTHER)
Admission: EM | Admit: 2021-05-28 | Discharge: 2021-05-28 | Disposition: A | Discharge status: Home / Self Care | Payer: Medicaid Other | Attending: Emergency Medicine | Admitting: Emergency Medicine

## 2021-05-28 DIAGNOSIS — M25511 Pain in right shoulder: Secondary | ICD-10-CM | POA: Insufficient documentation

## 2021-05-28 DIAGNOSIS — E876 Hypokalemia: Secondary | ICD-10-CM | POA: Diagnosis not present

## 2021-05-28 DIAGNOSIS — R519 Headache, unspecified: Secondary | ICD-10-CM | POA: Diagnosis present

## 2021-05-28 DIAGNOSIS — R112 Nausea with vomiting, unspecified: Secondary | ICD-10-CM | POA: Insufficient documentation

## 2021-05-28 DIAGNOSIS — H53149 Visual discomfort, unspecified: Secondary | ICD-10-CM | POA: Diagnosis not present

## 2021-05-28 LAB — BASIC METABOLIC PANEL
Anion gap: 6 (ref 5–15)
BUN: 27 mg/dL — ABNORMAL HIGH (ref 6–20)
CO2: 25 mmol/L (ref 22–32)
Calcium: 9.9 mg/dL (ref 8.9–10.3)
Chloride: 107 mmol/L (ref 98–111)
Creatinine, Ser: 0.95 mg/dL (ref 0.44–1.00)
GFR, Estimated: >60 mL/min (ref >60)
Glucose, Bld: 109 mg/dL — ABNORMAL HIGH (ref 70–99)
Potassium: 3.3 mmol/L — ABNORMAL LOW (ref 3.5–5.1)
Sodium: 138 mmol/L (ref 135–145)

## 2021-05-28 LAB — URINALYSIS, ROUTINE W REFLEX MICROSCOPIC
Bilirubin Urine: NEGATIVE
Glucose, UA: NEGATIVE mg/dL
Hgb urine dipstick: NEGATIVE
Ketones, ur: NEGATIVE mg/dL
Leukocytes,Ua: NEGATIVE
Nitrite: NEGATIVE
Protein, ur: NEGATIVE mg/dL
Specific Gravity, Urine: 1.005 (ref 1.005–1.030)
pH: 7 (ref 5.0–8.0)

## 2021-05-28 LAB — CBC
HCT: 40.8 % (ref 36.0–46.0)
Hemoglobin: 13.4 g/dL (ref 12.0–15.0)
MCH: 28.7 pg (ref 26.0–34.0)
MCHC: 32.8 g/dL (ref 30.0–36.0)
MCV: 87.4 fL (ref 80.0–100.0)
Platelets: 167 10*3/uL (ref 150–400)
RBC: 4.67 MIL/uL (ref 3.87–5.11)
RDW: 12.8 % (ref 11.5–15.5)
WBC: 5.5 10*3/uL (ref 4.0–10.5)
nRBC: 0 % (ref 0.0–0.2)

## 2021-05-28 MED ORDER — PROCHLORPERAZINE EDISYLATE 10 MG/2ML IJ SOLN
10.0000 mg | Freq: Once | INTRAMUSCULAR | Status: AC
  Administered 2021-05-28: 10 mg via INTRAVENOUS
  Filled 2021-05-28: qty 2

## 2021-05-28 MED ORDER — DIPHENHYDRAMINE HCL 50 MG/ML IJ SOLN
50.0000 mg | Freq: Once | INTRAMUSCULAR | Status: AC
  Administered 2021-05-28: 50 mg via INTRAVENOUS
  Filled 2021-05-28: qty 1

## 2021-05-28 MED ORDER — SODIUM CHLORIDE 0.9 % IV BOLUS
1000.0000 mL | Freq: Once | INTRAVENOUS | Status: AC
  Administered 2021-05-28: 1000 mL via INTRAVENOUS

## 2021-05-28 NOTE — ED Provider Notes (Signed)
?[facility] ?Provider Note ? ? ?CSN: [id number] ?Arrival date & time: 05/28/21  [id number] ? ?  ? ?History ? ?Chief Complaint  ?Patient presents with  ? Headache  ? ? ?Kristina Jimenez is a 46 y.o. female. ? ?The history is provided by the patient and medical records. No language interpreter was used.  ?Headache ?Pain location:  Generalized ?Quality:  Dull ?Radiates to:  Does not radiate ?Severity currently:  9/10 ?Severity at highest:  9/10 ?Onset quality:  Gradual ?Duration:  2 days ?Timing:  Constant ?Progression:  Unchanged ?Similar to prior headaches: yes   ?Context: bright light   ?Relieved by:  Nothing ?Worsened by:  Nothing ?Ineffective treatments:  None tried ?Associated symptoms: nausea, photophobia and vomiting   ?Associated symptoms: no abdominal pain, no back pain, no blurred vision, no congestion, no cough, no diarrhea, no dizziness, no eye pain, no facial pain, no fatigue, no fever, no myalgias, no neck pain, no neck stiffness, no numbness, no paresthesias, no seizures, no sinus pressure, no tingling, no visual change and no weakness   ? ?  ? ?Home Medications ?Prior to Admission medications   ?Medication Sig Start Date End Date Taking? Authorizing Provider  ?Cholecalciferol (VITAMIN D) 50 MCG (2000 UT) tablet Take 1 tablet (2,000 Units total) by mouth daily. 03/09/20   Masoudi, Dorthula Rue, MD  ?diclofenac Sodium (VOLTAREN) 1 % GEL Apply 2 g topically 4 (four) times daily. 03/08/20   Masoudi, Dorthula Rue, MD  ?ferrous sulfate 325 (65 FE) MG EC tablet Take 1 tablet (325 mg total) by mouth 2 (two) times daily. 03/09/20 03/09/21  Masoudi, Dorthula Rue, MD  ?topiramate (TOPAMAX) 25 MG tablet Take 2 tablets (50 mg total) by mouth 2 (two) times daily. Take 25 mg orally once daily in the evening for first week, 25 mg twice daily for second week, 25 mg in the morning and 50 mg in the evening for third week, and then 50 mg twice daily 07/12/20 07/12/21  Alexandria Lodge, MD  ?   ? ?Allergies     ?Hydrocodone-acetaminophen   ? ?Review of Systems   ?Review of Systems  ?Constitutional:  Negative for chills, fatigue and fever.  ?HENT:  Negative for congestion and sinus pressure.   ?Eyes:  Positive for photophobia. Negative for blurred vision and pain.  ?Respiratory:  Negative for cough, chest tightness, shortness of breath and wheezing.   ?Cardiovascular:  Negative for chest pain.  ?Gastrointestinal:  Positive for nausea and vomiting. Negative for abdominal pain, constipation and diarrhea.  ?Musculoskeletal:  Negative for back pain, myalgias, neck pain and neck stiffness.  ?Skin:  Negative for rash and wound.  ?Neurological:  Positive for headaches. Negative for dizziness, seizures, syncope, speech difficulty, weakness, light-headedness, numbness and paresthesias.  ?Psychiatric/Behavioral:  Negative for agitation.   ?All other systems reviewed and are negative. ? ?Physical Exam ?Updated Vital Signs ?BP 99/71   Pulse 60   Temp 98.4 ?F (36.9 ?C)   Resp 20   Ht 5\' 2"  (1.575 m)   Wt 56.8 kg   SpO2 99%   BMI 22.90 kg/m?  ?Physical Exam ?Vitals and nursing note reviewed.  ?Constitutional:   ?   General: She is not in acute distress. ?   Appearance: She is well-developed. She is not ill-appearing, toxic-appearing or diaphoretic.  ?HENT:  ?   Head: Normocephalic and atraumatic.  ?   Mouth/Throat:  ?   Mouth: Mucous membranes are moist.  ?Eyes:  ?   Conjunctiva/sclera: Conjunctivae normal.  ?Cardiovascular:  ?  Rate and Rhythm: Normal rate and regular rhythm.  ?   Heart sounds: No murmur heard. ?Pulmonary:  ?   Effort: Pulmonary effort is normal. No respiratory distress.  ?   Breath sounds: Normal breath sounds. No wheezing, rhonchi or rales.  ?Chest:  ?   Chest wall: No tenderness.  ?Abdominal:  ?   Palpations: Abdomen is soft.  ?   Tenderness: There is no abdominal tenderness.  ?Musculoskeletal:     ?   General: No swelling.  ?   Cervical back: Neck supple.  ?Skin: ?   General: Skin is warm and dry.  ?    Capillary Refill: Capillary refill takes less than 2 seconds.  ?Neurological:  ?   Mental Status: She is alert. Mental status is at baseline.  ?   Cranial Nerves: No cranial nerve deficit, dysarthria or facial asymmetry.  ?   Motor: No weakness.  ?Psychiatric:     ?   Mood and Affect: Mood normal. Mood is not anxious.  ? ? ?ED Results / Procedures / Treatments   ?Labs ?(all labs ordered are listed, but only abnormal results are displayed) ?Labs Reviewed  ?BASIC METABOLIC PANEL - Abnormal; Notable for the following components:  ?    Result Value  ? Potassium 3.3 (*)   ? Glucose, Bld 109 (*)   ? BUN 27 (*)   ? All other components within normal limits  ?URINALYSIS, ROUTINE W REFLEX MICROSCOPIC - Abnormal; Notable for the following components:  ? Color, Urine COLORLESS (*)   ? All other components within normal limits  ?CBC  ? ? ?EKG ?None ? ?Radiology ?DG Shoulder Right ? ?Result Date: 05/28/2021 ?CLINICAL DATA:  Right shoulder pain, no known injury, initial encounter EXAM: RIGHT SHOULDER - 2+ VIEW COMPARISON:  None. FINDINGS: There is no evidence of fracture or dislocation. There is no evidence of arthropathy or other focal bone abnormality. Soft tissues are unremarkable. IMPRESSION: No acute abnormality noted. Electronically Signed   By: Inez Catalina M.D.   On: 05/28/2021 22:36   ? ?Procedures ?Procedures  ? ? ?Medications Ordered in ED ?Medications  ?sodium chloride 0.9 % bolus 1,000 mL (0 mLs Intravenous Stopped 05/28/21 2255)  ?prochlorperazine (COMPAZINE) injection 10 mg (10 mg Intravenous Given 05/28/21 2153)  ?diphenhydrAMINE (BENADRYL) injection 50 mg (50 mg Intravenous Given 05/28/21 2154)  ? ? ?ED Course/ Medical Decision Making/ A&P ?  ?                        ?Medical Decision Making ?Amount and/or Complexity of Data Reviewed ?Labs: ordered. ?Radiology: ordered. ? ?Risk ?Prescription drug management. ? ? ? ?Kristina Jimenez is a 46 y.o. female with a past medical history significant for migraines, documentation  of chronic low energy, and insomnia who presents with headache.  According to patient, she had headache since yesterday.  This feels similar to headache she had 5 months ago and had to get a headache cocktail for her.  She reports some nausea and vomiting.  She denies any focal neurologic deficits with no numbness, tingling, weakness of extremities.  She does not report any speech abnormalities, vision changes, or other neurologic complaints.  She denies neck pain or neck stiffness.  Denies fevers.  Reports no significant chest pain, palpitations, shortness of breath, abdominal pain, constipation, diarrhea, or urinary changes.  Denies any symptoms in extremities.  She does report she has had chronic shoulder pain that has been bothering her more recently.  She reports the pain is moderate and she feels that she is dehydrated with dry mouth. ? ?On exam, lungs clear and chest nontender.  Right shoulder slightly tender to palpation but intact sensation, strength, and pulses in all extremities.  Normal finger-nose-finger testing.  Symmetric smile.  Pupils symmetric and reactive, extract movements.  Neck nontender with normal range of motion.  No stridor appreciated.  Intact sensation and strength in legs.  Patient otherwise well-appearing. ? ?Patient spoke [place] and daughter wanted to interpret. ? ?Clinically I do suspect patient has dehydration and migrainous type headache.  Patient was sitting in the dark to help her symptoms. ? ?Given history of similar symptoms, will give a headache cocktail and reassess.  She had some screening labs in triage that did not show evidence of acute inflammation or infection with normal urine, normal kidney function, and CBC reassuring.  Mild hypokalemia at 3.3. ? ?We will get x-ray of the shoulder as she was requesting this however I have low suspicion for acute injury based on exam. ? ?If headache is feeling better, dissipate discharge home. ? ?11:48 PM ?Headache is now 0 out of 10  from a 9 out of 10.  She is feeling completely normal.  Shoulder x-ray showed no abnormality or fracture and labs were overall reassuring aside from mild hypokalemia as we discussed.  Patient will use over-the-counter

## 2021-05-28 NOTE — Discharge Instructions (Signed)
Your history, exam, work-up today were overall reassuring.  Your headache resolved after medications and we feel you are safe for discharge home.  The x-ray your shoulder did not show acute abnormality.  Your potassium was slightly low so please make sure you are eating and drinking well.  Please rest and stay hydrated.  If any symptoms change or worsen acutely, please return to the nearest emergency department. ?

## 2021-05-28 NOTE — ED Triage Notes (Signed)
Pt presented via triage with c/o HA , nausea, hot/cold flashes and generalized weakness that started this AM.  ? ?Pt admits that she have noticed a decrease in her weight and suffering with low BP  ? ?

## 2021-05-30 ENCOUNTER — Encounter: Payer: Medicaid Other | Admitting: Student

## 2021-06-01 ENCOUNTER — Encounter: Payer: Self-pay | Admitting: Student

## 2021-06-21 ENCOUNTER — Ambulatory Visit: Payer: Medicaid Other | Admitting: Internal Medicine

## 2021-06-21 VITALS — BP 94/68 | HR 65 | Temp 98.1°F

## 2021-06-21 DIAGNOSIS — R682 Dry mouth, unspecified: Secondary | ICD-10-CM | POA: Diagnosis present

## 2021-06-21 DIAGNOSIS — H04123 Dry eye syndrome of bilateral lacrimal glands: Secondary | ICD-10-CM

## 2021-06-21 DIAGNOSIS — M25511 Pain in right shoulder: Secondary | ICD-10-CM

## 2021-06-21 DIAGNOSIS — M5412 Radiculopathy, cervical region: Secondary | ICD-10-CM

## 2021-06-21 DIAGNOSIS — G8929 Other chronic pain: Secondary | ICD-10-CM | POA: Diagnosis not present

## 2021-06-21 DIAGNOSIS — M3501 Sicca syndrome with keratoconjunctivitis: Secondary | ICD-10-CM | POA: Insufficient documentation

## 2021-06-21 MED ORDER — NAPROXEN 500 MG PO TABS: 500.0000 mg | ORAL_TABLET | Freq: Two times a day (BID) | ORAL | 2 refills | Status: AC

## 2021-06-21 MED ORDER — METHOCARBAMOL 750 MG PO TABS: 750.0000 mg | ORAL_TABLET | Freq: Three times a day (TID) | ORAL | 0 refills | Status: AC

## 2021-06-21 NOTE — Patient Instructions (Signed)
Thank you, [name] for allowing [place] to provide your care today.   I have ordered the following labs for you:  Lab Orders         Early Sjogren's Syndrome Profile         Vitamin B12       Start the following medications: Meds ordered this encounter  Medications   naproxen (NAPROSYN) 500 MG tablet    Sig: Take 1 tablet (500 mg total) by mouth 2 (two) times daily with a meal.    Dispense:  60 tablet    Refill:  2   methocarbamol (ROBAXIN-750) 750 MG tablet    Sig: Take 1 tablet (750 mg total) by mouth 3 (three) times daily for 7 days.    Dispense:  21 tablet    Refill:  0     Follow up: 2 weeks   Remember: Take naproxen during the day, twice a day with meal; Take Robaxin at night (take no more than 3 times a day) and if you are not working you can take it during the day. Alternate between naproxen and Robaxin  Should you have any questions or concerns please call the internal medicine clinic at [phone].    Timothy Lasso, MD [place]

## 2021-06-21 NOTE — Assessment & Plan Note (Signed)
Patient endorsed dry mouth and dry eye for the last several months.  Despite fluid resuscitation the dryness continues.  Given her concomitant chronic pains, there is suspicion for autoimmune condition, Sjogren syndrome.   P: -Sjogren's studies -Follow-up in 2 weeks

## 2021-06-21 NOTE — Assessment & Plan Note (Addendum)
Patient presents today complaining of right shoulder pain that radiates to her wrist.  She is unable to describe the sensation but she feels like the pain is deep in her arm and radiates down to the wrist.  The pain is intermittent throughout the day and night.  She also has associated muscle cramping of the right trapezius muscle.  She has tried over-the-counter Tylenol, Advil, and ibuprofen with minimal relief.  She has not attempted warm/cool compress.  She denies any recent falls or trauma to the right shoulder/arm.  She works at [employer] and lifts heavy boxes, bags and dishes.  After the workday, she does not notice any change in her pain.  She denies muscle weakness.  On physical exam, strength and 6 station intact in bilateral arms.  Full range of motion of the neck.  When she turns her head to the contralateral side, it elicits muscle tightness of the right trapezius muscle.  Spurling test negative.  Differentials include cervical radiculopathy versus muscle spasm versus electrolyte derangement.  Her pain is unilateral making electrolyte derangement less likely the main etiology.  However, most recent BMP significant for hypokalemia at 3.3.  She denies any muscle weakness.  Given history of radiating pain, can obtain plain film of cervical spine to rule out radiculopathy.  However, images can appear normal, which may warrant further imaging.  If pain persists, consider CT.  Patient endorsed muscle cramping of the trapezius muscle, if the muscle group is bunched up near a nerve, it could temporarily impinge the nerve resulting in patient's current symptoms.  We will start with Robaxin 750 TID for 7 days and naproxen 500 mg twice daily for 7 days.  If symptoms persist, pursue further imaging as stated above.  We will also check potassium and vitamin B12 to rule out any deficiencies contributing to current symptoms.  P: -X-ray of cervical spine -Robaxin-750 3 times daily x7 days -Naproxen 500 mg twice  daily x7 days -Follow-up potassium levels and vitamin B12 levels -Work note provided

## 2021-06-21 NOTE — Progress Notes (Signed)
   CC: Right shoulder pain, dry eyes and dry mouth  HPI:  [name] is a 46 y.o. female with a past medical history stated below and presents today for CC listed above.  Patient's daughter present, translated in [place].  Please see problem based assessment and plan for additional details.  Past Medical History:  Diagnosis Date   Bright red rectal bleeding 07/12/2020    Current Outpatient Medications on File Prior to Visit  Medication Sig Dispense Refill   Cholecalciferol (VITAMIN D) 50 MCG (2000 UT) tablet Take 1 tablet (2,000 Units total) by mouth daily. 90 tablet 2   diclofenac Sodium (VOLTAREN) 1 % GEL Apply 2 g topically 4 (four) times daily. 2 g 1   ferrous sulfate 325 (65 FE) MG EC tablet Take 1 tablet (325 mg total) by mouth 2 (two) times daily. 60 tablet 3   topiramate (TOPAMAX) 25 MG tablet Take 2 tablets (50 mg total) by mouth 2 (two) times daily. Take 25 mg orally once daily in the evening for first week, 25 mg twice daily for second week, 25 mg in the morning and 50 mg in the evening for third week, and then 50 mg twice daily 60 tablet 2   No current facility-administered medications on file prior to visit.    No family history on file.  Social History   Socioeconomic History   Marital status: Married    Spouse name: Not on file   Number of children: Not on file   Years of education: Not on file   Highest education level: Not on file  Occupational History   Not on file  Tobacco Use   Smoking status: Never   Smokeless tobacco: Not on file  Substance and Sexual Activity   Alcohol use: Not on file   Drug use: Not on file   Sexual activity: Not on file  Other Topics Concern   Not on file  Social History Narrative   Not on file   Social Determinants of Health   Financial Resource Strain: Not on file  Food Insecurity: Not on file  Transportation Needs: Not on file  Physical Activity: Not on file  Stress: Not on file  Social Connections: Not on file   Intimate Partner Violence: Not on file    Review of Systems: ROS negative except for what is noted on the assessment and plan.  Vitals:   06/21/21 1540 06/21/21 1611  BP: (!) 89/57 94/68  Pulse: 68 65  Temp: 98.1 F (36.7 C)   TempSrc: Oral   SpO2: 98%      Physical Exam: Constitutional: well-appearing woman sitting in the chair, in no acute distress HENT: normocephalic atraumatic, mucous membranes moist Eyes: conjunctiva non-erythematous Neck: supple Cardiovascular: regular rate and rhythm, no m/r/g Pulmonary/Chest: normal work of breathing on room air, lungs clear to auscultation bilaterally Abdominal: soft, non-tender, non-distended MSK: normal bulk and tone; Spurling test negative; no point tenderness of cervical spine; range of motion of upper extremities intact; muscle tightness/tenderness of the right trapezius muscle with palpation Neurological: alert & oriented x 3, 5/5 strength in bilateral upper and lower extremities, normal gait Skin: warm and dry Psych: Normal mood, normal behavior   Assessment & Plan:   See Encounters Tab for problem based charting.  Patient seen with Dr. Sylvester Harder, M.D. [facility] Medicine, PGY-1 Pager: [phone], Phone: [phone] Date 06/21/2021 Time 5:01 PM

## 2021-06-22 LAB — VITAMIN B12: Vitamin B-12: 363 pg/mL (ref 232–1245)

## 2021-06-22 LAB — POTASSIUM: Potassium: 3.6 mmol/L (ref 3.5–5.2)

## 2021-06-23 ENCOUNTER — Other Ambulatory Visit: Payer: Self-pay | Admitting: Internal Medicine

## 2021-06-23 DIAGNOSIS — M3500 Sicca syndrome, unspecified: Secondary | ICD-10-CM

## 2021-06-23 NOTE — Progress Notes (Signed)
Sjogren work up positive. I called patient with lab results and symptom treatment management using interpreter [phone]. Follow up appt scheduled for June 1st, 2023.  The following labs need to be obtained to assess disease activity, prognosis and/or extraglandular involvement: CBC w/ diff ESR &CRP CMP UA Hepatitis C  HIV Vitamin A  The lab orders are placed as future orders. Also patient will need referrals to opthalmology and dental.

## 2021-06-25 LAB — ENA+DNA/DS+ANTICH+CENTRO+FA...
Anti JO-1: <0.2 AI (ref 0.0–0.9)
Centromere Ab Screen: <0.2 AI (ref 0.0–0.9)
Chromatin Ab SerPl-aCnc: <0.2 AI (ref 0.0–0.9)
ENA RNP Ab: <0.2 AI (ref 0.0–0.9)
ENA SM Ab Ser-aCnc: <0.2 AI (ref 0.0–0.9)
Scleroderma (Scl-70) (ENA) Antibody, IgG: <0.2 AI (ref 0.0–0.9)
Speckled Pattern: 1:1280 {titer} — ABNORMAL HIGH
dsDNA Ab: 2 IU/mL (ref 0–9)

## 2021-06-25 LAB — SJOGREN'S SYNDROME ANTIBODS(SSA + SSB)
ENA SSA (RO) Ab: >8 AI — ABNORMAL HIGH (ref 0.0–0.9)
ENA SSB (LA) Ab: 4 AI — ABNORMAL HIGH (ref 0.0–0.9)

## 2021-06-25 LAB — ANA: ANA Titer 1: POSITIVE — AB

## 2021-06-25 LAB — RHEUMATOID FACTOR: Rheumatoid fact SerPl-aCnc: 16.2 IU/mL — ABNORMAL HIGH (ref <14.0)

## 2021-06-29 ENCOUNTER — Encounter (HOSPITAL_BASED_OUTPATIENT_CLINIC_OR_DEPARTMENT_OTHER): Payer: Self-pay | Admitting: Emergency Medicine

## 2021-06-29 ENCOUNTER — Other Ambulatory Visit: Payer: Self-pay

## 2021-06-29 DIAGNOSIS — S3991XA Unspecified injury of abdomen, initial encounter: Secondary | ICD-10-CM | POA: Diagnosis present

## 2021-06-29 DIAGNOSIS — S301XXA Contusion of abdominal wall, initial encounter: Secondary | ICD-10-CM | POA: Diagnosis not present

## 2021-06-29 DIAGNOSIS — Y92410 Unspecified street and highway as the place of occurrence of the external cause: Secondary | ICD-10-CM | POA: Insufficient documentation
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
## 2021-06-30 ENCOUNTER — Other Ambulatory Visit: Payer: Self-pay

## 2021-06-30 ENCOUNTER — Ambulatory Visit (HOSPITAL_COMMUNITY)
Admission: RE | Admit: 2021-06-30 | Discharge: 2021-06-30 | Disposition: A | Discharge status: Home / Self Care | Payer: Medicaid Other | Source: Ambulatory Visit | Attending: Internal Medicine | Admitting: Internal Medicine

## 2021-06-30 ENCOUNTER — Encounter: Payer: Self-pay | Admitting: Internal Medicine

## 2021-06-30 ENCOUNTER — Emergency Department (HOSPITAL_BASED_OUTPATIENT_CLINIC_OR_DEPARTMENT_OTHER)
Admission: EM | Admit: 2021-06-30 | Discharge: 2021-06-30 | Disposition: A | Discharge status: Home / Self Care | Payer: Medicaid Other | Attending: Emergency Medicine | Admitting: Emergency Medicine

## 2021-06-30 ENCOUNTER — Ambulatory Visit (INDEPENDENT_AMBULATORY_CARE_PROVIDER_SITE_OTHER): Payer: Medicaid Other | Admitting: Internal Medicine

## 2021-06-30 ENCOUNTER — Emergency Department (HOSPITAL_BASED_OUTPATIENT_CLINIC_OR_DEPARTMENT_OTHER): Payer: Medicaid Other

## 2021-06-30 VITALS — BP 101/56 | HR 93 | Temp 98.3°F | Ht 64.0 in | Wt 130.7 lb

## 2021-06-30 DIAGNOSIS — M3501 Sicca syndrome with keratoconjunctivitis: Secondary | ICD-10-CM

## 2021-06-30 DIAGNOSIS — M3500 Sicca syndrome, unspecified: Secondary | ICD-10-CM

## 2021-06-30 DIAGNOSIS — S301XXA Contusion of abdominal wall, initial encounter: Secondary | ICD-10-CM

## 2021-06-30 HISTORY — DX: Headache, unspecified: R51.9

## 2021-06-30 LAB — BASIC METABOLIC PANEL
Anion gap: 7 (ref 5–15)
BUN: 31 mg/dL — ABNORMAL HIGH (ref 6–20)
CO2: 24 mmol/L (ref 22–32)
Calcium: 9.3 mg/dL (ref 8.9–10.3)
Chloride: 107 mmol/L (ref 98–111)
Creatinine, Ser: 1.28 mg/dL — ABNORMAL HIGH (ref 0.44–1.00)
GFR, Estimated: 53 mL/min — ABNORMAL LOW (ref >60)
Glucose, Bld: 96 mg/dL (ref 70–99)
Potassium: 3.2 mmol/L — ABNORMAL LOW (ref 3.5–5.1)
Sodium: 138 mmol/L (ref 135–145)

## 2021-06-30 LAB — CBC WITH DIFFERENTIAL/PLATELET
Abs Immature Granulocytes: 0.03 10*3/uL (ref 0.00–0.07)
Basophils Absolute: 0 10*3/uL (ref 0.0–0.1)
Basophils Relative: 1 %
Eosinophils Absolute: 0.2 10*3/uL (ref 0.0–0.5)
Eosinophils Relative: 3 %
HCT: 36.8 % (ref 36.0–46.0)
Hemoglobin: 12.3 g/dL (ref 12.0–15.0)
Immature Granulocytes: 1 %
Lymphocytes Relative: 41 %
Lymphs Abs: 2.7 10*3/uL (ref 0.7–4.0)
MCH: 28.9 pg (ref 26.0–34.0)
MCHC: 33.4 g/dL (ref 30.0–36.0)
MCV: 86.4 fL (ref 80.0–100.0)
Monocytes Absolute: 0.6 10*3/uL (ref 0.1–1.0)
Monocytes Relative: 8 %
Neutro Abs: 3.1 10*3/uL (ref 1.7–7.7)
Neutrophils Relative %: 46 %
Platelets: 149 10*3/uL — ABNORMAL LOW (ref 150–400)
RBC: 4.26 MIL/uL (ref 3.87–5.11)
RDW: 12.4 % (ref 11.5–15.5)
WBC: 6.7 10*3/uL (ref 4.0–10.5)
nRBC: 0 % (ref 0.0–0.2)

## 2021-06-30 LAB — HCG, SERUM, QUALITATIVE: Preg, Serum: NEGATIVE

## 2021-06-30 MED ORDER — IOHEXOL 300 MG/ML  SOLN
100.0000 mL | Freq: Once | INTRAMUSCULAR | Status: AC | PRN
  Administered 2021-06-30: 100 mL via INTRAVENOUS

## 2021-06-30 MED ORDER — SODIUM CHLORIDE 0.9 % IV BOLUS
1000.0000 mL | Freq: Once | INTRAVENOUS | Status: AC
  Administered 2021-06-30: 1000 mL via INTRAVENOUS
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
## 2021-06-30 NOTE — Discharge Instructions (Signed)
Take ibuprofen 600 mg every 6 hours as needed for pain.  Return to the emergency department if symptoms significantly worsen or change. 

## 2021-06-30 NOTE — ED Notes (Addendum)
Patient transported to CT 

## 2021-06-30 NOTE — Progress Notes (Signed)
Internal Medicine Clinic Attending  I saw and evaluated the patient.  I personally confirmed the key portions of the history and exam documented by the resident  and I reviewed pertinent patient test results.  The assessment, diagnosis, and plan were formulated together and I agree with the documentation in the resident's note. SSA+SSB and RF+ suggestive of sjogrens syndrome.  Will have patient return for additional labs to evaluate for any organ involvement.
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
## 2021-06-30 NOTE — ED Notes (Signed)
Patient transported to CT 
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
## 2021-07-01 LAB — MICROSCOPIC EXAMINATION
Bacteria, UA: NONE SEEN
Casts: NONE SEEN /lpf
RBC, Urine: NONE SEEN /hpf (ref 0–2)

## 2021-07-01 LAB — URINALYSIS, ROUTINE W REFLEX MICROSCOPIC
Bilirubin, UA: NEGATIVE
Glucose, UA: NEGATIVE
Ketones, UA: NEGATIVE
Nitrite, UA: NEGATIVE
Protein,UA: NEGATIVE
RBC, UA: NEGATIVE
Specific Gravity, UA: 1.026 (ref 1.005–1.030)
Urobilinogen, Ur: 0.2 mg/dL (ref 0.2–1.0)
pH, UA: 7 (ref 5.0–7.5)

## 2021-07-01 NOTE — Progress Notes (Signed)
Internal Medicine Clinic Attending  Case discussed with Dr. Patel  At the time of the visit.  We reviewed the resident's history and exam and pertinent patient test results.  I agree with the assessment, diagnosis, and plan of care documented in the resident's note.  

## 2021-07-03 LAB — CBC WITH DIFFERENTIAL/PLATELET
Basophils Absolute: 0 10*3/uL (ref 0.0–0.2)
Basos: 0 %
EOS (ABSOLUTE): 0.2 10*3/uL (ref 0.0–0.4)
Eos: 4 %
Hematocrit: 38.6 % (ref 34.0–46.6)
Hemoglobin: 13 g/dL (ref 11.1–15.9)
Immature Grans (Abs): 0 10*3/uL (ref 0.0–0.1)
Immature Granulocytes: 0 %
Lymphocytes Absolute: 1.5 10*3/uL (ref 0.7–3.1)
Lymphs: 36 %
MCH: 29.1 pg (ref 26.6–33.0)
MCHC: 33.7 g/dL (ref 31.5–35.7)
MCV: 86 fL (ref 79–97)
Monocytes Absolute: 0.4 10*3/uL (ref 0.1–0.9)
Monocytes: 10 %
Neutrophils Absolute: 2.1 10*3/uL (ref 1.4–7.0)
Neutrophils: 50 %
Platelets: 148 10*3/uL — ABNORMAL LOW (ref 150–450)
RBC: 4.47 x10E6/uL (ref 3.77–5.28)
RDW: 12.6 % (ref 11.7–15.4)
WBC: 4.2 10*3/uL (ref 3.4–10.8)

## 2021-07-03 LAB — CMP14 + ANION GAP
ALT: 14 IU/L (ref 0–32)
AST: 15 IU/L (ref 0–40)
Albumin/Globulin Ratio: 1.3 (ref 1.2–2.2)
Albumin: 3.8 g/dL (ref 3.8–4.8)
Alkaline Phosphatase: 104 IU/L (ref 44–121)
Anion Gap: 14 mmol/L (ref 10.0–18.0)
BUN/Creatinine Ratio: 25 — ABNORMAL HIGH (ref 9–23)
BUN: 25 mg/dL — ABNORMAL HIGH (ref 6–24)
Bilirubin Total: 0.2 mg/dL (ref 0.0–1.2)
CO2: 17 mmol/L — ABNORMAL LOW (ref 20–29)
Calcium: 9 mg/dL (ref 8.7–10.2)
Chloride: 109 mmol/L — ABNORMAL HIGH (ref 96–106)
Creatinine, Ser: 0.99 mg/dL (ref 0.57–1.00)
Globulin, Total: 3 g/dL (ref 1.5–4.5)
Glucose: 90 mg/dL (ref 70–99)
Potassium: 3.5 mmol/L (ref 3.5–5.2)
Sodium: 140 mmol/L (ref 134–144)
Total Protein: 6.8 g/dL (ref 6.0–8.5)
eGFR: 73 mL/min/{1.73_m2} (ref >59)

## 2021-07-03 LAB — VITAMIN A: Vitamin A: 32.7 ug/dL (ref 20.1–62.0)

## 2021-07-03 LAB — HIV ANTIBODY (ROUTINE TESTING W REFLEX): HIV Screen 4th Generation wRfx: NONREACTIVE

## 2021-07-03 LAB — HEPATITIS C ANTIBODY: Hep C Virus Ab: NONREACTIVE

## 2021-07-03 LAB — SEDIMENTATION RATE: Sed Rate: 8 mm/hr (ref 0–32)

## 2021-07-03 LAB — C-REACTIVE PROTEIN: CRP: <1 mg/L (ref 0–10)

## 2021-08-03 ENCOUNTER — Telehealth: Payer: Self-pay

## 2021-08-03 ENCOUNTER — Other Ambulatory Visit: Payer: Self-pay | Admitting: Student

## 2021-08-03 DIAGNOSIS — Z1231 Encounter for screening mammogram for malignant neoplasm of breast: Secondary | ICD-10-CM

## 2021-08-03 NOTE — Telephone Encounter (Signed)
Requesting x-ray test results, please call pt back.  

## 2021-08-08 ENCOUNTER — Other Ambulatory Visit (INDEPENDENT_AMBULATORY_CARE_PROVIDER_SITE_OTHER): Payer: Medicaid Other | Admitting: Internal Medicine

## 2021-08-08 DIAGNOSIS — M5412 Radiculopathy, cervical region: Secondary | ICD-10-CM

## 2021-08-08 NOTE — Progress Notes (Signed)
Physical the

## 2021-08-12 ENCOUNTER — Ambulatory Visit
Admission: RE | Admit: 2021-08-12 | Discharge: 2021-08-12 | Disposition: A | Discharge status: Home / Self Care | Payer: Medicaid Other | Source: Ambulatory Visit | Attending: *Deleted | Admitting: *Deleted

## 2021-08-12 DIAGNOSIS — Z1231 Encounter for screening mammogram for malignant neoplasm of breast: Secondary | ICD-10-CM

## 2021-08-16 ENCOUNTER — Other Ambulatory Visit: Payer: Self-pay | Admitting: Internal Medicine

## 2021-08-16 DIAGNOSIS — R928 Other abnormal and inconclusive findings on diagnostic imaging of breast: Secondary | ICD-10-CM

## 2021-08-23 ENCOUNTER — Other Ambulatory Visit: Payer: Self-pay | Admitting: Internal Medicine

## 2021-08-23 ENCOUNTER — Ambulatory Visit
Admission: RE | Admit: 2021-08-23 | Discharge: 2021-08-23 | Disposition: A | Discharge status: Home / Self Care | Payer: Medicaid Other | Source: Ambulatory Visit | Attending: Internal Medicine | Admitting: Internal Medicine

## 2021-08-23 DIAGNOSIS — R928 Other abnormal and inconclusive findings on diagnostic imaging of breast: Secondary | ICD-10-CM

## 2021-08-23 DIAGNOSIS — N6310 Unspecified lump in the right breast, unspecified quadrant: Secondary | ICD-10-CM

## 2021-08-27 ENCOUNTER — Ambulatory Visit: Payer: Medicaid Other | Attending: Internal Medicine

## 2021-08-27 ENCOUNTER — Other Ambulatory Visit: Payer: Self-pay

## 2021-08-27 DIAGNOSIS — M6281 Muscle weakness (generalized): Secondary | ICD-10-CM | POA: Diagnosis present

## 2021-08-27 DIAGNOSIS — M5412 Radiculopathy, cervical region: Secondary | ICD-10-CM | POA: Insufficient documentation

## 2021-08-27 DIAGNOSIS — R293 Abnormal posture: Secondary | ICD-10-CM | POA: Diagnosis present

## 2021-08-27 DIAGNOSIS — M542 Cervicalgia: Secondary | ICD-10-CM | POA: Insufficient documentation

## 2021-08-27 DIAGNOSIS — M79601 Pain in right arm: Secondary | ICD-10-CM | POA: Diagnosis present
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
## 2021-09-03 ENCOUNTER — Ambulatory Visit: Payer: Medicaid Other | Attending: Internal Medicine | Admitting: Physical Therapy

## 2021-09-03 ENCOUNTER — Encounter: Payer: Self-pay | Admitting: Physical Therapy

## 2021-09-03 DIAGNOSIS — M79601 Pain in right arm: Secondary | ICD-10-CM | POA: Diagnosis present

## 2021-09-03 DIAGNOSIS — M542 Cervicalgia: Secondary | ICD-10-CM | POA: Insufficient documentation

## 2021-09-03 DIAGNOSIS — M6281 Muscle weakness (generalized): Secondary | ICD-10-CM | POA: Insufficient documentation

## 2021-09-03 DIAGNOSIS — R293 Abnormal posture: Secondary | ICD-10-CM | POA: Diagnosis present
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
## 2021-09-07 ENCOUNTER — Ambulatory Visit: Payer: Medicaid Other

## 2021-09-07 DIAGNOSIS — M542 Cervicalgia: Secondary | ICD-10-CM | POA: Diagnosis not present

## 2021-09-07 DIAGNOSIS — M79601 Pain in right arm: Secondary | ICD-10-CM

## 2021-09-07 DIAGNOSIS — R293 Abnormal posture: Secondary | ICD-10-CM

## 2021-09-07 DIAGNOSIS — M6281 Muscle weakness (generalized): Secondary | ICD-10-CM
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
## 2021-09-08 ENCOUNTER — Ambulatory Visit: Payer: Medicaid Other | Admitting: Physical Therapy

## 2021-09-10 ENCOUNTER — Ambulatory Visit: Payer: Medicaid Other

## 2021-09-10 DIAGNOSIS — M79601 Pain in right arm: Secondary | ICD-10-CM

## 2021-09-10 DIAGNOSIS — M542 Cervicalgia: Secondary | ICD-10-CM

## 2021-09-10 DIAGNOSIS — R293 Abnormal posture: Secondary | ICD-10-CM

## 2021-09-10 DIAGNOSIS — M6281 Muscle weakness (generalized): Secondary | ICD-10-CM
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
## 2021-09-17 ENCOUNTER — Ambulatory Visit: Payer: Medicaid Other

## 2021-09-17 DIAGNOSIS — M542 Cervicalgia: Secondary | ICD-10-CM

## 2021-09-17 DIAGNOSIS — M79601 Pain in right arm: Secondary | ICD-10-CM

## 2021-09-17 DIAGNOSIS — M6281 Muscle weakness (generalized): Secondary | ICD-10-CM

## 2021-09-17 DIAGNOSIS — R293 Abnormal posture: Secondary | ICD-10-CM
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
## 2021-09-24 ENCOUNTER — Ambulatory Visit: Payer: Medicaid Other
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
## 2021-10-21 ENCOUNTER — Ambulatory Visit: Payer: Medicaid Other | Attending: Internal Medicine | Admitting: Internal Medicine

## 2021-12-09 ENCOUNTER — Emergency Department (HOSPITAL_BASED_OUTPATIENT_CLINIC_OR_DEPARTMENT_OTHER)
Admission: EM | Admit: 2021-12-09 | Discharge: 2021-12-09 | Discharge status: Against Medical Advice | Payer: Medicaid Other | Source: Home / Self Care

## 2021-12-09 ENCOUNTER — Other Ambulatory Visit: Payer: Self-pay

## 2022-02-27 ENCOUNTER — Ambulatory Visit
Admission: RE | Admit: 2022-02-27 | Discharge: 2022-02-27 | Disposition: A | Discharge status: Home / Self Care | Payer: Medicaid Other | Source: Ambulatory Visit | Attending: Internal Medicine | Admitting: Internal Medicine

## 2022-02-27 ENCOUNTER — Other Ambulatory Visit: Payer: Self-pay | Admitting: Internal Medicine

## 2022-02-27 DIAGNOSIS — N6310 Unspecified lump in the right breast, unspecified quadrant: Secondary | ICD-10-CM

## 2022-03-01 ENCOUNTER — Ambulatory Visit (INDEPENDENT_AMBULATORY_CARE_PROVIDER_SITE_OTHER): Payer: Medicaid Other | Admitting: Student

## 2022-03-01 VITALS — BP 96/65 | HR 59 | Temp 98.4°F | Wt 138.4 lb

## 2022-03-01 DIAGNOSIS — J069 Acute upper respiratory infection, unspecified: Secondary | ICD-10-CM | POA: Diagnosis not present

## 2022-03-01 MED ORDER — GUAIFENESIN-DM 100-10 MG/5ML PO SYRP: 5.0000 mL | ORAL_SOLUTION | ORAL | 0 refills | Status: AC | PRN

## 2022-03-01 NOTE — Patient Instructions (Signed)
Kristina Jimenez, it was a pleasure seeing you today!  Today we discussed: - I have prescribed cough syrup for your symptoms. In addition, I would get over the counter nasal spray for your runny nose. This is called Afrin.  -?? ???? ????? ?? ?? ??? ???? ????? ???? ????? ?? ??? ? ?? ??? ?? ????? ???? ?? ???? ???? ???? ??? ?? ?????? ????. ??? ??? ?? ?? ??? ?????.  Follow-up:  if symptoms do not improve    Please make sure to arrive 15 minutes prior to your next appointment. If you arrive late, you may be asked to reschedule.   We look forward to seeing you next time. Please call our clinic at [phone] if you have any questions or concerns. The best time to call is Monday-Friday from 9am-4pm, but there is someone available 24/7. If after hours or the weekend, call the main hospital number and ask for the Internal Medicine Resident On-Call. If you need medication refills, please notify your pharmacy one week in advance and they will send [place] a request.  Thank you for letting us take part in your care. Wishing you the best!  Thank you, Sanjuan Dame, MD

## 2022-03-01 NOTE — Progress Notes (Signed)
   CC: cough, sore throat  HPI:  [name] is a 47 y.o. person with medical history as below presenting to [facility] for cough and sore throat.  Please see problem-based list for further details, assessments, and plans.  Past Medical History:  Diagnosis Date   Bright red rectal bleeding 07/12/2020   Headache    Review of Systems:  As per HPI  Physical Exam:  Vitals:   03/01/22 1539  BP: 96/65  Pulse: (!) 59  Temp: 98.4 F (36.9 C)  TempSrc: Oral  SpO2: 97%  Weight: 138 lb 6.4 oz (62.8 kg)   General: Resting comfortably in chair, no acute distress HENT: Normocephalic, atraumatic. No cervical lymphadenopathy. No tonsillar exudates. No nasal polyps bilaterally. Clear nasal drainage appreciated.  Eyes: Vision grossly in tact. No conjunctival injection.  CV: Regular rate, rhythm. No murmurs appreciated.  Pulm: Normal work of breathing on room air. Clear to auscultation bilaterally.  Skin: Warm, dry. No rashes or lesions appreciated.  Neuro: Awake, alert, conversing appropriately.  Psych: Normal mood, affect, speech.   Assessment & Plan:   Upper respiratory infection, acute Patient is presenting to clinic today with two day history of non-productive cough, runny nose, sore throat and headache. Describes symptoms came on gradually over the last few days, no sick contacts. Does report feeling some chills and muscle aches but denies any fevers. She has been able to eat and drinking without difficulty, no nausea, vomiting, or diarrhea. Patient is wondering about medications to help with symptoms. She would like to defer COVID or influenza testing today, she has been fully vaccinated against both.   Discussed symptomatic treatment for likely viral upper respiratory infection. Blood pressure is mildly soft, but this appears to be her baseline, no orthostatic symptoms.   - Robitussin-DM for cough - Return if symptoms do not improve or worsen  Patient discussed with Dr.  Denita Lung, MD Internal Medicine PGY-3 Pager: [phone]

## 2022-03-02 DIAGNOSIS — J069 Acute upper respiratory infection, unspecified: Secondary | ICD-10-CM | POA: Insufficient documentation

## 2022-03-02 NOTE — Assessment & Plan Note (Signed)
Patient is presenting to clinic today with two day history of non-productive cough, runny nose, sore throat and headache. Describes symptoms came on gradually over the last few days, no sick contacts. Does report feeling some chills and muscle aches but denies any fevers. She has been able to eat and drinking without difficulty, no nausea, vomiting, or diarrhea. Patient is wondering about medications to help with symptoms. She would like to defer COVID or influenza testing today, she has been fully vaccinated against both.   Discussed symptomatic treatment for likely viral upper respiratory infection. Blood pressure is mildly soft, but this appears to be her baseline, no orthostatic symptoms.   - Robitussin-DM for cough - Return if symptoms do not improve or worsen

## 2022-03-06 NOTE — Progress Notes (Signed)
Internal Medicine Clinic Attending  Case discussed with the resident at the time of the visit.  We reviewed the resident's history and exam and pertinent patient test results.  I agree with the assessment, diagnosis, and plan of care documented in the resident's note.  

## 2022-06-18 IMAGING — CR DG CERVICAL SPINE COMPLETE 4+V
6 series · 6 of 6 positions shown · non-contrast
Comparison: None Available.

CLINICAL DATA: Cervical radiculopathy.

EXAM:
CERVICAL SPINE - COMPLETE 4+ VIEW

[c-spine lat]
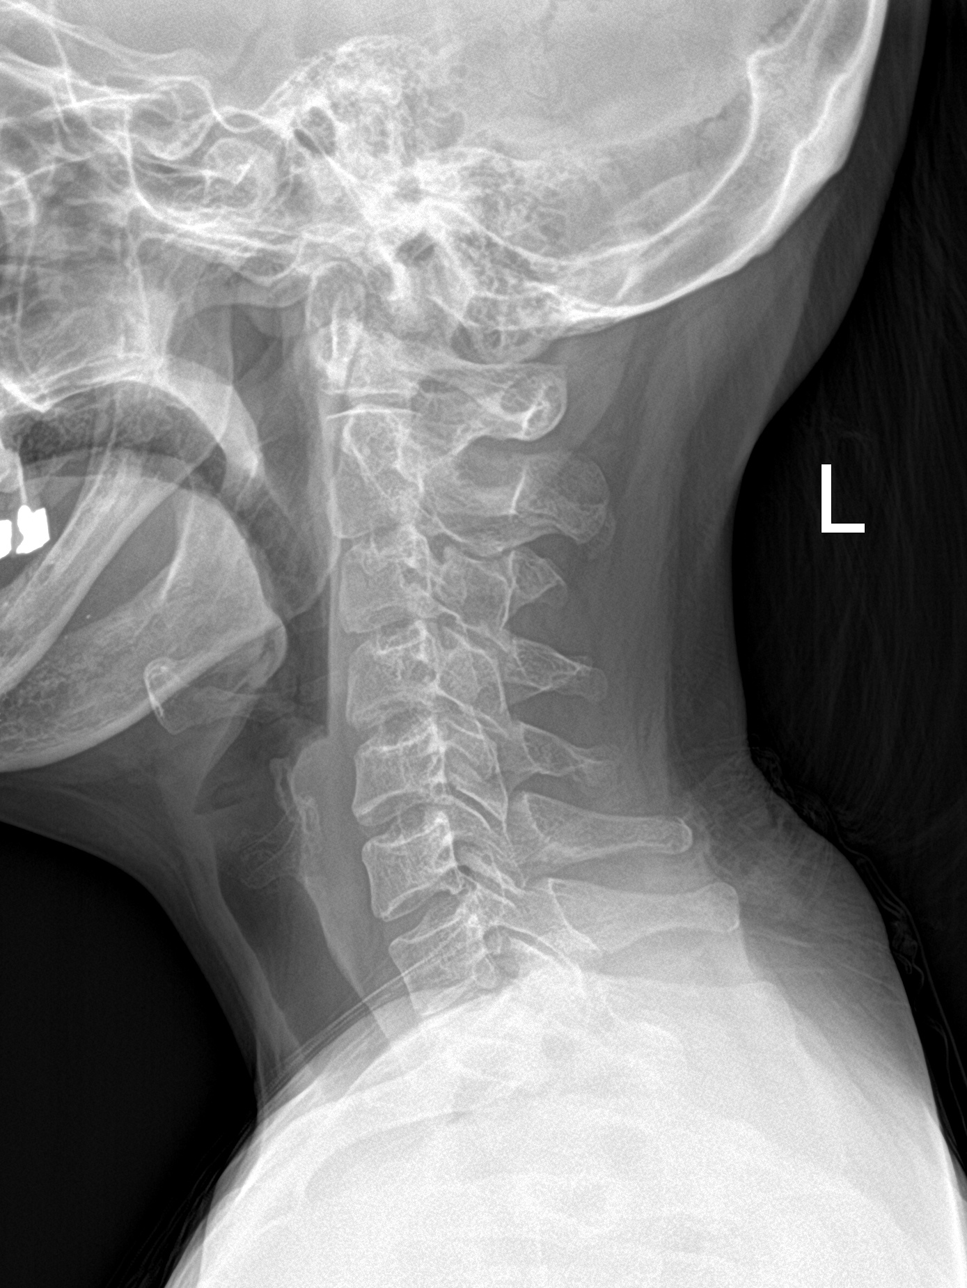

[c-spine obl (1 of 2)]
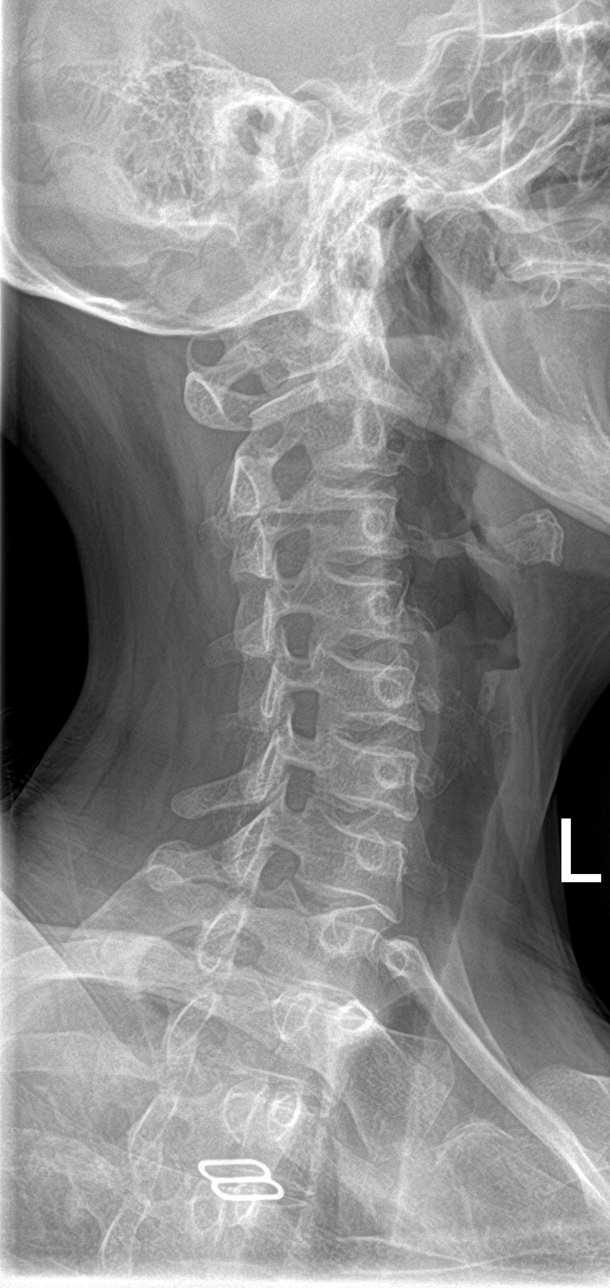

[c-spine obl (2 of 2)]
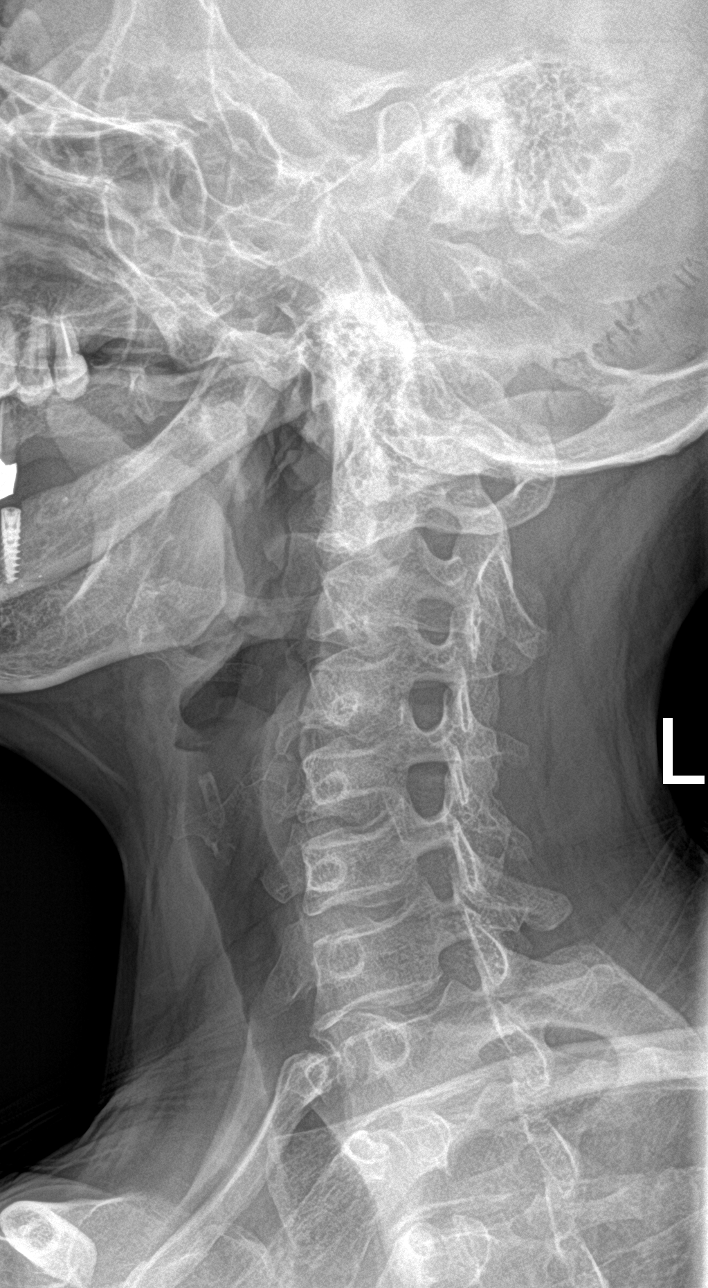

[c-spine ap]
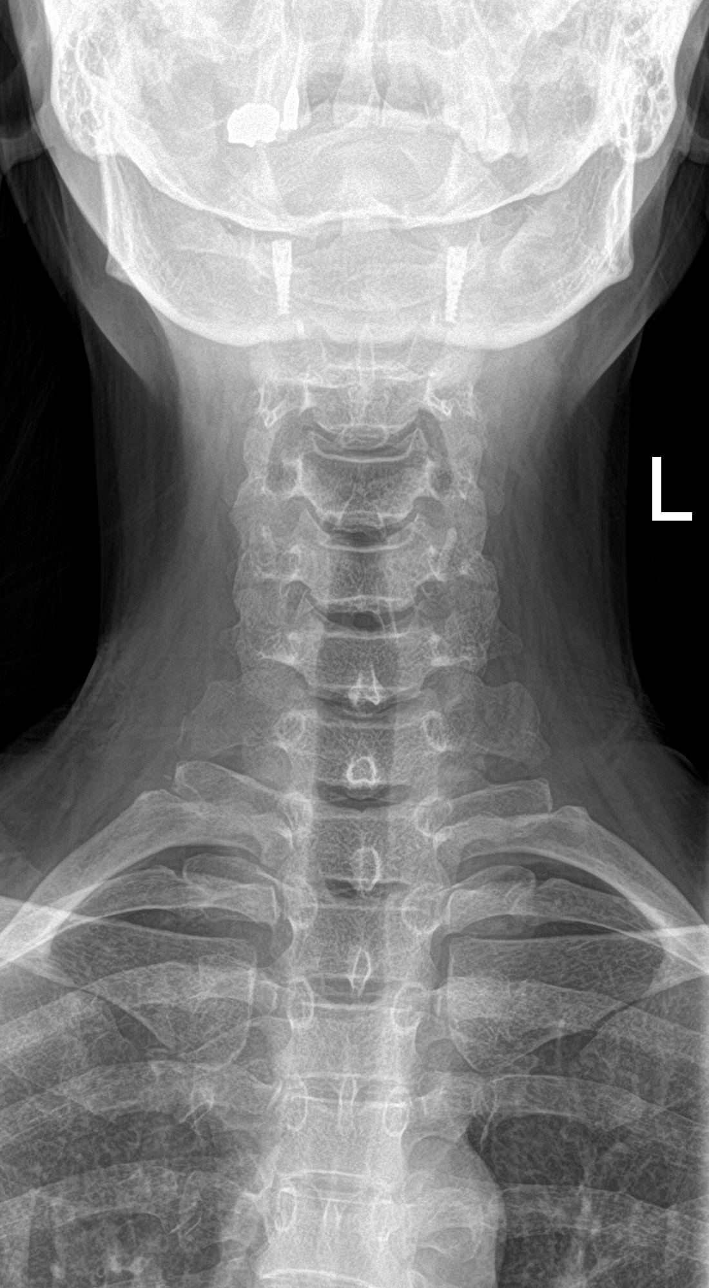

[c-spine open mouth (1 of 2)]
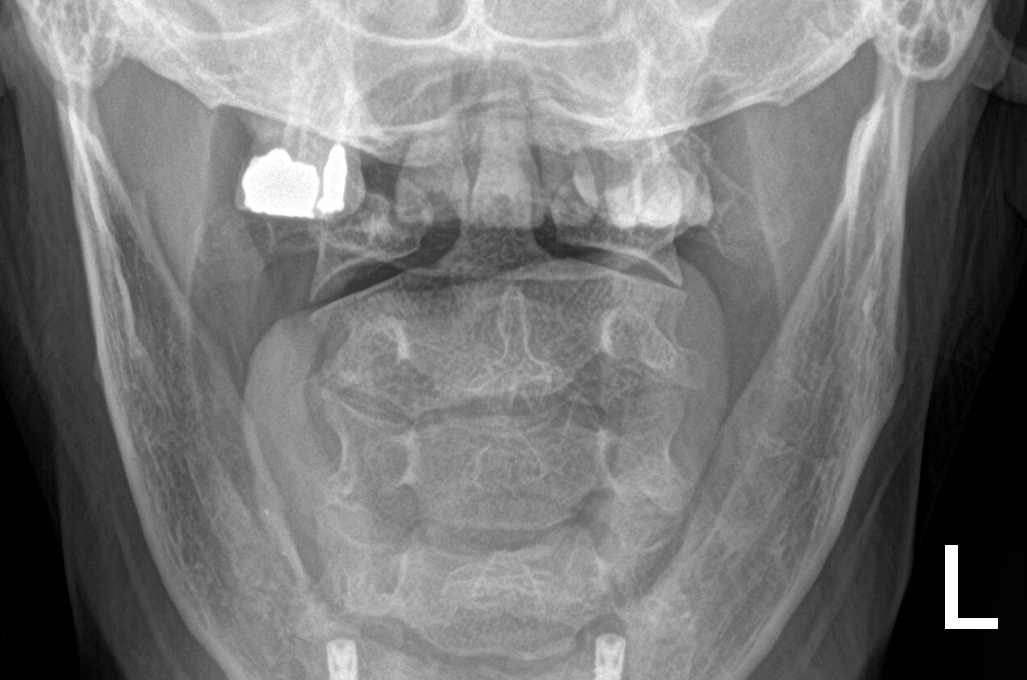

[c-spine open mouth (2 of 2)]
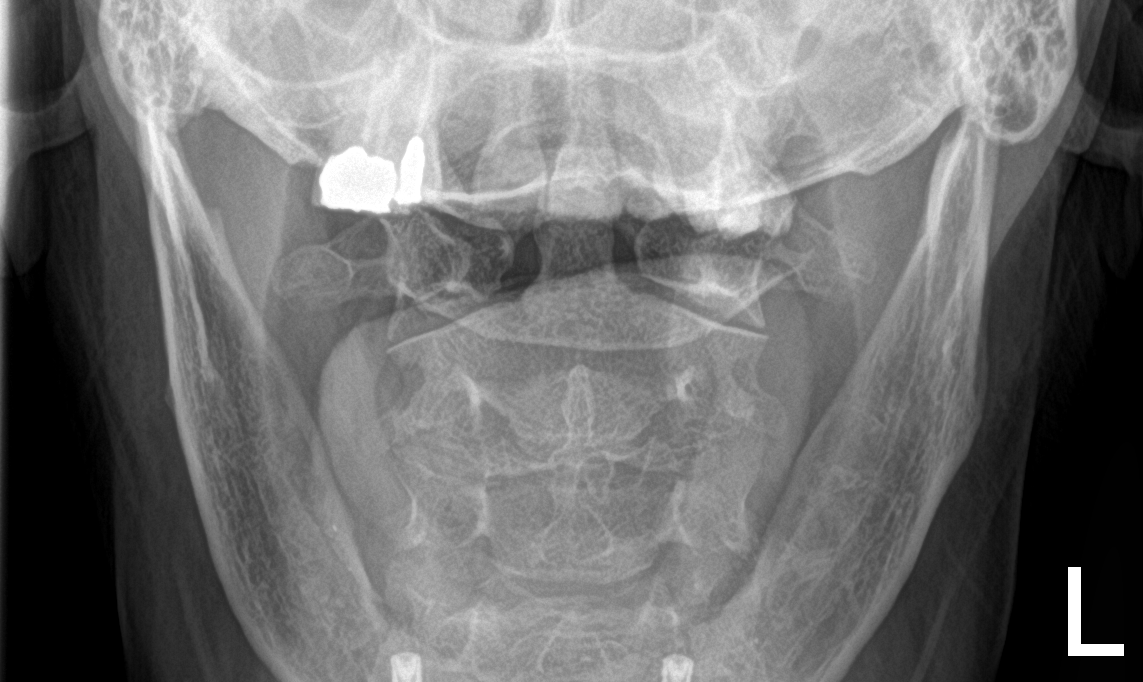

[6 of 6 positions shown; findings below may reference images not displayed]

FINDINGS: Normal anatomic alignment. No evidence for acute fracture or
dislocation. Lateral masses articulate appropriately with the dens.
No significant osseous neural foraminal narrowing. Lung apices are
clear.
IMPRESSION: No acute osseous abnormality.  No significant degenerative changes.

## 2022-06-19 ENCOUNTER — Ambulatory Visit: Payer: Medicaid Other | Admitting: Student

## 2022-06-19 VITALS — BP 92/43 | HR 70 | Temp 98.6°F

## 2022-06-19 DIAGNOSIS — M654 Radial styloid tenosynovitis [de Quervain]: Secondary | ICD-10-CM

## 2022-06-19 DIAGNOSIS — G8929 Other chronic pain: Secondary | ICD-10-CM

## 2022-06-19 DIAGNOSIS — E061 Subacute thyroiditis: Secondary | ICD-10-CM

## 2022-06-19 DIAGNOSIS — M5412 Radiculopathy, cervical region: Secondary | ICD-10-CM | POA: Diagnosis not present

## 2022-06-19 MED ORDER — DICLOFENAC SODIUM 1 % EX GEL: 2.0000 g | Freq: Four times a day (QID) | CUTANEOUS | Status: DC

## 2022-06-19 MED ORDER — DICLOFENAC SODIUM 1 % EX GEL: 2.0000 g | Freq: Four times a day (QID) | CUTANEOUS | 1 refills | Status: AC

## 2022-06-19 NOTE — Patient Instructions (Addendum)
It was a pleasure seeing you in clinic today   De Duervain's Tenosynovitis Please start wearing a thumb spica splint that you can get at the pharmacy  Continue to ice the area You can use voltaren gel to help with pain Avoid heavy lifting at work  I do not think you need an xray as I do not think this is a bony issue  I have made a referral to orthopedics for your right shoulder  Follow up in 4 weeks if left hand pain is not improving

## 2022-06-20 DIAGNOSIS — M654 Radial styloid tenosynovitis [de Quervain]: Secondary | ICD-10-CM | POA: Insufficient documentation
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
## 2022-06-21 NOTE — Progress Notes (Signed)
Internal Medicine Clinic Attending ? ?Case discussed with Dr. Liang  At the time of the visit.  We reviewed the resident?s history and exam and pertinent patient test results.  I agree with the assessment, diagnosis, and plan of care documented in the resident?s note. ? ?

## 2022-06-21 NOTE — Assessment & Plan Note (Addendum)
Patient reports left radial wrist pain radiating to her thumb for the last few months.  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  Feels that she has been using her left arm more due to chronic right shoulder discomfort.  Patient with significant pain over the radial styloid.  Stretch of the thumb tendons reproduces pain.  Suspect her pain to de Quervain's tenosynovitis in the setting of her work.  Voltaren gel, Tylenol, naproxen as needed for pain Recommended patient wear a thumb spica splint and modify activity Follow-up if pain is not improving in 1 month

## 2022-06-21 NOTE — Assessment & Plan Note (Signed)
Patient continues to have right shoulder and neck pain that radiates down her arm.  She takes Tylenol and naproxen as needed. Cervical spine imaging 2023 without significant acute osseous abnormalities.  Did go to PT last year without significant improvement.  Reports that Robaxin close shortness of breath.  She has had no further shortness of breath since discontinuing this.  Reports she had injections of her shoulder with no improvement.  She would like further evaluation with orthopedics.  Tylenol and pain.  Continue shoulder impingement.  Referral made to orthopedics

## 2022-06-22 ENCOUNTER — Other Ambulatory Visit (INDEPENDENT_AMBULATORY_CARE_PROVIDER_SITE_OTHER): Payer: Medicaid Other

## 2022-06-22 ENCOUNTER — Ambulatory Visit (INDEPENDENT_AMBULATORY_CARE_PROVIDER_SITE_OTHER): Payer: Medicaid Other | Admitting: Orthopaedic Surgery

## 2022-06-22 DIAGNOSIS — M25511 Pain in right shoulder: Secondary | ICD-10-CM | POA: Diagnosis not present

## 2022-06-22 DIAGNOSIS — M25532 Pain in left wrist: Secondary | ICD-10-CM | POA: Diagnosis not present

## 2022-06-22 MED ORDER — DICLOFENAC SODIUM 75 MG PO TBEC: 75.0000 mg | DELAYED_RELEASE_TABLET | Freq: Two times a day (BID) | ORAL | 2 refills | Status: AC

## 2022-06-22 MED ORDER — PREDNISONE 10 MG (21) PO TBPK
ORAL_TABLET | ORAL | 3 refills | Status: AC
Start: 2022-06-22 — End: ?
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
## 2022-06-29 ENCOUNTER — Encounter: Payer: Self-pay | Admitting: *Deleted

## 2022-07-18 ENCOUNTER — Other Ambulatory Visit: Payer: Self-pay | Admitting: Physician Assistant

## 2022-07-18 MED ORDER — MELOXICAM 7.5 MG PO TABS: 7.5000 mg | ORAL_TABLET | Freq: Every day | ORAL | 2 refills | Status: AC | PRN

## 2022-08-25 ENCOUNTER — Ambulatory Visit
Admission: RE | Admit: 2022-08-25 | Discharge: 2022-08-25 | Disposition: A | Discharge status: Home / Self Care | Payer: Medicaid Other | Source: Ambulatory Visit | Attending: Internal Medicine | Admitting: Internal Medicine

## 2022-08-25 DIAGNOSIS — N6310 Unspecified lump in the right breast, unspecified quadrant: Secondary | ICD-10-CM

## 2023-08-29 ENCOUNTER — Ambulatory Visit: Payer: Self-pay

## 2023-08-29 NOTE — Telephone Encounter (Signed)
 FYI Only or Action Required?: FYI only for provider.  Patient was last seen in primary care on 06/19/2022 by Lemon Raisin, MD.  Called Nurse Triage reporting Fatigue.  Symptoms began x 3 weeks.  Interventions attempted: Nothing.  Symptoms are: gradually worsening.  Triage Disposition: See PCP When Office is Open (Within 3 Days)  Patient/caregiver understands and will follow disposition?: Yes    Copied from CRM #[id number]. Topic: Clinical - Red Word Triage >> Aug 29, 2023 12:46 PM Brittney F wrote: Red Word that prompted transfer to Nurse Triage:   Patient is feeling very weak; sometimes feels like she is about to faint   Patient's daughter : French Maw [phone] Reason for Disposition  [1] MILD weakness (e.g., does not interfere with ability to work, go to school, normal activities) AND [2] persists > 1 week  Answer Assessment - Initial Assessment Questions 1. DESCRIPTION: Describe how you are feeling.     Weak, as if about to faint 2. SEVERITY: How bad is it?  Can you stand and walk?     Moderate to severe 3. ONSET: When did these symptoms begin? (e.g., hours, days, weeks, months)     X 3 weeks 4. CAUSE: What do you think is causing the weakness or fatigue? (e.g., not drinking enough fluids, medical problem, trouble sleeping)     Pt states she drinks fluids well 5. NEW MEDICINES:  Have you started on any new medicines recently? (e.g., opioid pain medicines, benzodiazepines, muscle relaxants, antidepressants, antihistamines, neuroleptics, beta blockers)      no 6. OTHER SYMPTOMS: Do you have any other symptoms? (e.g., chest pain, fever, cough, SOB, vomiting, diarrhea, bleeding, other areas of pain)     Feels like body is numb b/c so weak, darkest under bilateral eye 7. PREGNANCY: Is there any chance you are pregnant? When was your last menstrual period?     Na  Low appetite to eat x 3 weeks.  Pt would like to also check FSBS in office: because she has  not been able to check it.  Protocols used: Weakness (Generalized) and Fatigue-A-AH

## 2023-08-29 NOTE — Telephone Encounter (Signed)
 Pt  has been schedule tomorrow w/Dr Koomson per CRM. Called pt's daughter  to f/u on pt's symptoms - no answer; left message on vm to return call or if pt's symptoms become worse to go to UC/ER.

## 2023-08-30 ENCOUNTER — Ambulatory Visit: Payer: Self-pay | Admitting: Student

## 2023-08-30 VITALS — BP 91/58 | HR 63 | Temp 98.1°F | Ht 64.0 in | Wt 132.6 lb

## 2023-08-30 DIAGNOSIS — R5383 Other fatigue: Secondary | ICD-10-CM | POA: Diagnosis present

## 2023-08-30 DIAGNOSIS — I959 Hypotension, unspecified: Secondary | ICD-10-CM | POA: Diagnosis not present

## 2023-08-30 DIAGNOSIS — R7303 Prediabetes: Secondary | ICD-10-CM

## 2023-08-30 LAB — GLUCOSE, CAPILLARY: Glucose-Capillary: 94 mg/dL (ref 70–99)

## 2023-08-30 LAB — POCT GLYCOSYLATED HEMOGLOBIN (HGB A1C): Hemoglobin A1C: 5.5 % (ref 4.0–5.6)

## 2023-08-30 NOTE — Patient Instructions (Signed)
 It was a pleasure taking care of you today!    1.  The causes of fatigue is very broad.  I will call you with the results of the labs, hopefully that provide further information about what is causing your tiredness.  2.  Continue to exercise and stay hydrated.  3.  Continue to eat healthy.  I have ordered the following labs for you:   Lab Orders         CBC         Comprehensive metabolic panel with GFR         TSH         T4, Free         Vitamin D  (25 hydroxy)         POC Hbg A1C       Follow up: 4 weeks, or Pending labs results will be.  Should you have any questions or concerns please call the internal medicine clinic at [phone].     Missy Sandhoff, MD  [facility]

## 2023-08-31 LAB — COMPREHENSIVE METABOLIC PANEL WITH GFR
ALT: 10 IU/L (ref 0–32)
AST: 14 IU/L (ref 0–40)
Albumin: 4.2 g/dL (ref 3.9–4.9)
Alkaline Phosphatase: 98 IU/L (ref 44–121)
BUN/Creatinine Ratio: 31 — ABNORMAL HIGH (ref 9–23)
BUN: 27 mg/dL — ABNORMAL HIGH (ref 6–24)
Bilirubin Total: 0.3 mg/dL (ref 0.0–1.2)
CO2: 16 mmol/L — ABNORMAL LOW (ref 20–29)
Calcium: 9.1 mg/dL (ref 8.7–10.2)
Chloride: 104 mmol/L (ref 96–106)
Creatinine, Ser: 0.88 mg/dL (ref 0.57–1.00)
Globulin, Total: 2.8 g/dL (ref 1.5–4.5)
Glucose: 90 mg/dL (ref 70–99)
Potassium: 4.4 mmol/L (ref 3.5–5.2)
Sodium: 136 mmol/L (ref 134–144)
Total Protein: 7 g/dL (ref 6.0–8.5)
eGFR: 83 mL/min/1.73 (ref >59)

## 2023-08-31 LAB — TSH: TSH: 6.2 u[IU]/mL — ABNORMAL HIGH (ref 0.450–4.500)

## 2023-08-31 LAB — CBC
Hematocrit: 38.7 % (ref 34.0–46.6)
Hemoglobin: 12.6 g/dL (ref 11.1–15.9)
MCH: 29.4 pg (ref 26.6–33.0)
MCHC: 32.6 g/dL (ref 31.5–35.7)
MCV: 90 fL (ref 79–97)
Platelets: 177 x10E3/uL (ref 150–450)
RBC: 4.28 x10E6/uL (ref 3.77–5.28)
RDW: 13 % (ref 11.7–15.4)
WBC: 3.9 x10E3/uL (ref 3.4–10.8)

## 2023-08-31 LAB — T4, FREE: Free T4: 0.87 ng/dL (ref 0.82–1.77)

## 2023-08-31 LAB — VITAMIN D 25 HYDROXY (VIT D DEFICIENCY, FRACTURES): Vit D, 25-Hydroxy: 19.1 ng/mL — ABNORMAL LOW (ref 30.0–100.0)

## 2023-09-03 ENCOUNTER — Encounter: Payer: Self-pay | Admitting: Student

## 2023-09-03 NOTE — Progress Notes (Signed)
 CC: 2 months of worsening fatigue.  HPI: Ms. Kristina Jimenez is a 48 year old female with a history of subclinical hypothyroidism, presenting with 1-2 months of progressively worsening fatigue. She has a prior history of fatigue of unclear etiology. Known history of subclinical hypothyroidism, though she denies weight gain, constipation, or other worsening hypothyroid symptoms. No recent illness, myalgias, or edema. She does note changes in the skin around her eyes.  Her last menstrual period was 3 years ago. She is gravida 2, para 2. Denies depression.She also has a history of softer blood pressures, she didn't eat or drink as she wanted to check her A1c.  Denies confusion or dizziness.  Patient was last seen in resident [facility] 2 years ago.  Please see problem based assessment and plan for additional details.  Past Medical History:  Diagnosis Date   Bright red rectal bleeding 07/12/2020   Headache     Current Outpatient Medications on File Prior to Visit  Medication Sig Dispense Refill   meloxicam  (MOBIC ) 7.5 MG tablet Take 1 tablet (7.5 mg total) by mouth daily as needed for pain. 60 tablet 2   Cholecalciferol (VITAMIN D ) 50 MCG (2000 UT) tablet Take 1 tablet (2,000 Units total) by mouth daily. 90 tablet 2   diclofenac  (VOLTAREN ) 75 MG EC tablet Take 1 tablet (75 mg total) by mouth 2 (two) times daily. 30 tablet 2   diclofenac  Sodium (VOLTAREN ) 1 % GEL Apply 2 g topically 4 (four) times daily. 2 g 1   ferrous sulfate  325 (65 FE) MG EC tablet Take 1 tablet (325 mg total) by mouth 2 (two) times daily. 60 tablet 3   guaiFENesin -dextromethorphan (ROBITUSSIN DM) 100-10 MG/5ML syrup Take 5 mLs by mouth every 4 (four) hours as needed for cough. 118 mL 0   predniSONE  (STERAPRED UNI-PAK 21 TAB) 10 MG (21) TBPK tablet Take as directed 21 tablet 3   No current facility-administered medications on file prior to visit.    Review of Systems: ROS negative except for what is noted on the assessment  and plan.  Vitals:   08/30/23 0829 08/30/23 0840 08/30/23 0900  BP: (!) 88/55 (!) 84/54 (!) 91/58  Pulse: 73 72 63  Temp: 98.1 F (36.7 C)    TempSrc: Oral    SpO2: 97%    Weight: 132 lb 9.6 oz (60.1 kg)    Height: 5' 4 (1.626 m)        Physical Exam: Constitutional: NAD Cardiovascular: RRR, no murmurs. Pulmonary/Chest: Clear bilateral lungs Abdominal: soft, non-tender, non-distended. Neuro: No focal weakness.  Muscle strength normal in both upper and lower extremities Skin: Hyperpigmentation around bilateral eyelids.  Assessment & Plan:   Patient discussed with Dr. Jeanelle  Assessment & Plan Fatigue, unspecified type Etiology of fatigue remains unclear. CBC from 2 years ago showed no evidence of anemia. History of subclinical hypothyroidism; prior TSH two years ago was 4.7. Worsening fatigue may be related to progression of subclinical hypothyroidism. Depression is unlikely, as she denies low mood and has a PHQ-9 score of 2. Other considerations include adrenal insufficiency or vitamin D  deficiency.  Plan:  Order CBC, BMP, and TSH. If initial labs are unremarkable, will check morning cortisol. Prediabetes A1c checked ago was 5.3.  Denies polyuria or polydipsia.  Weight has been stable. - Will get a repeat A1c.  Hypotension, unspecified hypotension type Asymptomatic, chronic.  Extremities warm and well-perfused, mentating well.  Blood pressure did improve with p.o. supplementation here in the clinic. - Encourage p.o. hydration.  Orders Placed This Encounter  Procedures   CBC   Comprehensive metabolic panel with GFR   TSH   T4, Free   Vitamin D  (25 hydroxy)   Glucose, capillary   POC Hbg A1C    Missy Sandhoff, MD [facility] Internal Medicine, PGY-2  Date 09/03/2023 Time 8:40 AM

## 2023-09-05 ENCOUNTER — Ambulatory Visit: Payer: Self-pay | Admitting: Student

## 2023-09-05 ENCOUNTER — Other Ambulatory Visit: Payer: Self-pay | Admitting: Student

## 2023-09-05 DIAGNOSIS — R5383 Other fatigue: Secondary | ICD-10-CM

## 2023-09-05 DIAGNOSIS — Z1231 Encounter for screening mammogram for malignant neoplasm of breast: Secondary | ICD-10-CM

## 2023-09-05 DIAGNOSIS — E559 Vitamin D deficiency, unspecified: Secondary | ICD-10-CM

## 2023-09-05 DIAGNOSIS — E038 Other specified hypothyroidism: Secondary | ICD-10-CM

## 2023-09-05 MED ORDER — LEVOTHYROXINE SODIUM 50 MCG PO TABS: 50.0000 ug | ORAL_TABLET | Freq: Every day | ORAL | 3 refills | Status: AC

## 2023-09-05 MED ORDER — VITAMIN D3 25 MCG (1000 UNIT) PO TABS: 1000.0000 [IU] | ORAL_TABLET | Freq: Every day | ORAL | 3 refills | Status: AC

## 2023-09-05 NOTE — Progress Notes (Signed)
 Low Vit D, will start po supplementation with 1000 international units  daily. Will recheck labs in 3 months

## 2023-09-05 NOTE — Progress Notes (Signed)
 TSH up trended from 4.50( 2 years ago) >>6.2, borderline low T4. Given progressively worsening fatigue, will start levothyroxine  25 mcg. Will  check TPO antibodies.  Will see patient back in 6-8 weeks to recheck TSH and T4

## 2023-09-06 NOTE — Telephone Encounter (Signed)
 Attempted to contact patient to rescheduled lab only visit that is currently schedule for tomorrow am.  Due to the nature of labs, pt will have to have labs drawn Monday-Thurs afternoon only  Or pt can go the the [facility] located inside the [facility] located at [address] [place]   No answer, message left on recorder for return call

## 2023-09-06 NOTE — Addendum Note (Signed)
 Addended by: ANTONE DWAYNE SAILOR on: 09/06/2023 01:23 PM   Modules accepted: Orders

## 2023-09-06 NOTE — Progress Notes (Signed)
 Internal Medicine Clinic Attending  Case discussed with the resident at the time of the visit.  We reviewed the resident's history and exam and pertinent patient test results.  I agree with the assessment, diagnosis, and plan of care documented in the resident's note.

## 2023-09-07 ENCOUNTER — Other Ambulatory Visit: Payer: Self-pay | Admitting: *Deleted

## 2023-09-07 ENCOUNTER — Telehealth: Payer: Self-pay | Admitting: *Deleted

## 2023-09-07 DIAGNOSIS — R5383 Other fatigue: Secondary | ICD-10-CM

## 2023-09-07 DIAGNOSIS — E038 Other specified hypothyroidism: Secondary | ICD-10-CM

## 2023-09-07 NOTE — Telephone Encounter (Signed)
 Patient here for labs.  Question about her last HbA1C .  Informed patient that it was 5.5.good range and that she is not Diabetic.

## 2023-09-08 LAB — IRON,TIBC AND FERRITIN PANEL
Ferritin: 280 ng/mL — ABNORMAL HIGH (ref 15–150)
Iron Saturation: 25 % (ref 15–55)
Iron: 83 ug/dL (ref 27–159)
Total Iron Binding Capacity: 329 ug/dL (ref 250–450)
UIBC: 246 ug/dL (ref 131–425)

## 2023-09-10 ENCOUNTER — Ambulatory Visit: Payer: Self-pay | Admitting: Student

## 2023-09-10 NOTE — Progress Notes (Signed)
 Normal iron labs, ferritin mildly increased.  Per chart review, she has a history of Sjogren disease, followed with rheumatology 2 months ago.

## 2023-09-14 LAB — ANTI-TPO AB (RDL): Anti-TPO Ab (RDL): <9 [IU]/mL (ref <9.0)

## 2023-09-17 NOTE — Progress Notes (Signed)
 Negative TPO antibodies.

## 2024-02-28 ENCOUNTER — Encounter (HOSPITAL_BASED_OUTPATIENT_CLINIC_OR_DEPARTMENT_OTHER): Payer: Self-pay

## 2024-02-28 ENCOUNTER — Emergency Department (HOSPITAL_BASED_OUTPATIENT_CLINIC_OR_DEPARTMENT_OTHER)
Admission: EM | Admit: 2024-02-28 | Discharge: 2024-02-29 | Disposition: A | Discharge status: Home / Self Care | Attending: Emergency Medicine | Admitting: Emergency Medicine

## 2024-02-28 ENCOUNTER — Other Ambulatory Visit: Payer: Self-pay

## 2024-02-28 DIAGNOSIS — Z9889 Other specified postprocedural states: Secondary | ICD-10-CM | POA: Insufficient documentation

## 2024-02-28 DIAGNOSIS — R112 Nausea with vomiting, unspecified: Secondary | ICD-10-CM | POA: Diagnosis present

## 2024-02-28 LAB — CBC WITH DIFFERENTIAL/PLATELET
Abs Immature Granulocytes: 0.01 10*3/uL (ref 0.00–0.07)
Basophils Absolute: 0 10*3/uL (ref 0.0–0.1)
Basophils Relative: 0 %
Eosinophils Absolute: 0.1 10*3/uL (ref 0.0–0.5)
Eosinophils Relative: 1 %
HCT: 37.1 % (ref 36.0–46.0)
Hemoglobin: 12.6 g/dL (ref 12.0–15.0)
Immature Granulocytes: 0 %
Lymphocytes Relative: 37 %
Lymphs Abs: 1.7 10*3/uL (ref 0.7–4.0)
MCH: 29.4 pg (ref 26.0–34.0)
MCHC: 34 g/dL (ref 30.0–36.0)
MCV: 86.7 fL (ref 80.0–100.0)
Monocytes Absolute: 0.3 10*3/uL (ref 0.1–1.0)
Monocytes Relative: 7 %
Neutro Abs: 2.5 10*3/uL (ref 1.7–7.7)
Neutrophils Relative %: 55 %
Platelets: 166 10*3/uL (ref 150–400)
RBC: 4.28 MIL/uL (ref 3.87–5.11)
RDW: 12.5 % (ref 11.5–15.5)
WBC: 4.6 10*3/uL (ref 4.0–10.5)
nRBC: 0 % (ref 0.0–0.2)

## 2024-02-28 LAB — COMPREHENSIVE METABOLIC PANEL WITH GFR
ALT: 9 U/L (ref 0–44)
AST: 21 U/L (ref 15–41)
Albumin: 4 g/dL (ref 3.5–5.0)
Alkaline Phosphatase: 86 U/L (ref 38–126)
Anion gap: 13 (ref 5–15)
BUN: 24 mg/dL — ABNORMAL HIGH (ref 6–20)
CO2: 22 mmol/L (ref 22–32)
Calcium: 9.5 mg/dL (ref 8.9–10.3)
Chloride: 105 mmol/L (ref 98–111)
Creatinine, Ser: 0.97 mg/dL (ref 0.44–1.00)
GFR, Estimated: >60 mL/min
Glucose, Bld: 105 mg/dL — ABNORMAL HIGH (ref 70–99)
Potassium: 3.6 mmol/L (ref 3.5–5.1)
Sodium: 140 mmol/L (ref 135–145)
Total Bilirubin: 0.3 mg/dL (ref 0.0–1.2)
Total Protein: 7.1 g/dL (ref 6.5–8.1)

## 2024-02-28 LAB — CBG MONITORING, ED: Glucose-Capillary: 95 mg/dL (ref 70–99)

## 2024-02-28 NOTE — ED Triage Notes (Signed)
 Pt w family, L wrist surgery approx 1430 (placed under general), given oxy after (approx 1630), vomiting, zofran w no relief (approx 1930).

## 2024-02-29 MED ORDER — SODIUM CHLORIDE 0.9 % IV BOLUS
1000.0000 mL | Freq: Once | INTRAVENOUS | Status: AC
  Administered 2024-02-29: 1000 mL via INTRAVENOUS

## 2024-02-29 MED ORDER — ONDANSETRON HCL 4 MG/2ML IJ SOLN
4.0000 mg | Freq: Once | INTRAMUSCULAR | Status: AC
  Administered 2024-02-29: 4 mg via INTRAVENOUS
  Filled 2024-02-29: qty 2

## 2024-02-29 NOTE — ED Provider Notes (Signed)
 " [place] EMERGENCY DEPARTMENT AT [facility] Provider Note   CSN: [id number] Arrival date & time: 02/28/24  2044     Patient presents with: Nausea, Dizziness, and Medication Reaction (Anesthesia/ oxy)   Kristina Jimenez is a 49 y.o. female.   HPI     This is a 49 year old female who presents with nausea and vomiting.  Patient had wrist surgery earlier today.  She went home and took 1 oxycodone.  She slept for about an hour.  She subsequently woke up with nausea and vomiting.  Daughter reports that she did take a Zofran  but that did not seem to help.  No abdominal pain.  States she feels generally weak and dehydrated. Prior to Admission medications  Medication Sig Start Date End Date Taking? Authorizing Provider  meloxicam  (MOBIC ) 7.5 MG tablet Take 1 tablet (7.5 mg total) by mouth daily as needed for pain. 07/18/22   Jule Ronal CROME, PA-C  cholecalciferol (VITAMIN D3) 25 MCG (1000 UNIT) tablet Take 1 tablet (1,000 Units total) by mouth daily. 09/05/23   Celestina Czar, MD  diclofenac  (VOLTAREN ) 75 MG EC tablet Take 1 tablet (75 mg total) by mouth 2 (two) times daily. 06/22/22   Jerri Kay HERO, MD  diclofenac  Sodium (VOLTAREN ) 1 % GEL Apply 2 g topically 4 (four) times daily. 06/19/22   Lemon Raisin, MD  ferrous sulfate  325 (65 FE) MG EC tablet Take 1 tablet (325 mg total) by mouth 2 (two) times daily. 03/09/20 03/09/21  Masoudi, Kelli, MD  guaiFENesin -dextromethorphan (ROBITUSSIN DM) 100-10 MG/5ML syrup Take 5 mLs by mouth every 4 (four) hours as needed for cough. 03/01/22   Susen Pastor, MD  levothyroxine  (SYNTHROID ) 50 MCG tablet Take 1 tablet (50 mcg total) by mouth daily. 09/05/23 09/04/24  Celestina Czar, MD  predniSONE  (STERAPRED UNI-PAK 21 TAB) 10 MG (21) TBPK tablet Take as directed 06/22/22   Jerri Kay HERO, MD    Allergies: Hydrocodone-acetaminophen    Review of Systems  Constitutional:  Negative for fever.  Respiratory:  Negative for shortness of breath.    Cardiovascular:  Negative for chest pain.  Gastrointestinal:  Positive for nausea and vomiting.  All other systems reviewed and are negative.   Updated Vital Signs BP 131/83   Pulse 100   Temp 98.5 F (36.9 C)   Resp 18   SpO2 100%   Physical Exam Vitals and nursing note reviewed.  Constitutional:      Appearance: She is well-developed. She is not ill-appearing.  HENT:     Head: Normocephalic and atraumatic.     Mouth/Throat:     Mouth: Mucous membranes are moist.  Eyes:     Pupils: Pupils are equal, round, and reactive to light.  Cardiovascular:     Rate and Rhythm: Normal rate and regular rhythm.     Heart sounds: Normal heart sounds.  Pulmonary:     Effort: Pulmonary effort is normal. No respiratory distress.     Breath sounds: No wheezing.  Abdominal:     Palpations: Abdomen is soft.     Tenderness: There is no abdominal tenderness. There is no guarding or rebound.  Musculoskeletal:     Cervical back: Neck supple.  Skin:    General: Skin is warm and dry.  Neurological:     Mental Status: She is alert and oriented to person, place, and time.  Psychiatric:        Mood and Affect: Mood normal.     (all labs ordered are listed, but  only abnormal results are displayed) Labs Reviewed  COMPREHENSIVE METABOLIC PANEL WITH GFR - Abnormal; Notable for the following components:      Result Value   Glucose, Bld 105 (*)    BUN 24 (*)    All other components within normal limits  CBC WITH DIFFERENTIAL/PLATELET  CBG MONITORING, ED    EKG: EKG Interpretation Date/Time:  Thursday February 28 2024 21:08:11 EST Ventricular Rate:  63 PR Interval:  169 QRS Duration:  112 QT Interval:  426 QTC Calculation: 437 R Axis:   82  Text Interpretation: Sinus rhythm Borderline intraventricular conduction delay Confirmed by Bari Pfeiffer ([id number]) on 02/28/2024 11:40:50 PM  Radiology: No results found.   Procedures   Medications Ordered in the ED  ondansetron  (ZOFRAN )  injection 4 mg (4 mg Intravenous Given 02/29/24 0018)  sodium chloride  0.9 % bolus 1,000 mL (0 mLs Intravenous Stopped 02/29/24 0123)  ondansetron  (ZOFRAN ) injection 4 mg (4 mg Intravenous Given 02/29/24 0146)                                    Medical Decision Making Amount and/or Complexity of Data Reviewed Labs: ordered.  Risk Prescription drug management.   This patient presents to the ED for concern of nausea vomiting, this involves an extensive number of treatment options, and is a complaint that carries with it a high risk of complications and morbidity.  I considered the following differential and admission for this acute, potentially life threatening condition.  The differential diagnosis includes postoperative nausea and vomiting related to anesthesia pain medications, less likely viral etiology or intra-abdominal pathology  MDM:    This is a 49 year old female who presents with nausea vomiting.  Had surgery earlier today.  She is nontoxic and vital signs are reassuring.  Appears well-hydrated.  Patient given fluids and nausea medication.  Labs obtained.  Labs and EKG are reassuring.  Patient progressively improved.  Reported some persistent nausea but I have encouraged her to drink small amounts of fluid and food.  She should not take pain medication on an empty stomach.  (Labs, imaging, consults)  Labs: I Ordered, and personally interpreted labs.  The pertinent results include: CBC, CMP, CBG  Imaging Studies ordered: I ordered imaging studies including N/A I independently visualized and interpreted imaging. I agree with the radiologist interpretation  Additional history obtained from daughter at bedside.  External records from outside source obtained and reviewed including prior evaluations  Cardiac Monitoring: The patient was maintained on a cardiac monitor.  If on the cardiac monitor, I personally viewed and interpreted the cardiac monitored which showed an underlying  rhythm of: Sinus  Reevaluation: After the interventions noted above, I reevaluated the patient and found that they have :improved  Social Determinants of Health:  lives independently  Disposition: Discharge  Co morbidities that complicate the patient evaluation  Past Medical History:  Diagnosis Date   Bright red rectal bleeding 07/12/2020   Headache      Medicines Meds ordered this encounter  Medications   ondansetron  (ZOFRAN ) injection 4 mg   sodium chloride  0.9 % bolus 1,000 mL   ondansetron  (ZOFRAN ) injection 4 mg    I have reviewed the patients home medicines and have made adjustments as needed  Problem List / ED Course: Problem List Items Addressed This Visit   None Visit Diagnoses       Post-operative nausea and vomiting    -  Primary                Final diagnoses:  Post-operative nausea and vomiting    ED Discharge Orders     None          Bari Charmaine FALCON, MD 02/29/24 [phone]  "

## 2024-02-29 NOTE — Discharge Instructions (Signed)
 You were seen today for postoperative nausea and vomiting.  Make sure that you are eating frequent small meals and continuing to take your Zofran  at home.  Do not take pain medication on an empty stomach.

## 2024-02-29 NOTE — ED Notes (Signed)
 Attempting to encourage pt to eat and drink pt reports ongoing nausea and upset stomach with taking sips of water. Pt had episode of emesis
